# Patient Record
Sex: Male | Born: 1990 | Race: Black or African American | Hispanic: No | Marital: Single | Smoking: Former smoker
Health system: Southern US, Community
[De-identification: ages and names within clinical notes are randomized; demographics above are authoritative.]

## PROBLEM LIST (undated history)

## (undated) DIAGNOSIS — S6290XA Unspecified fracture of unspecified wrist and hand, initial encounter for closed fracture: Secondary | ICD-10-CM

## (undated) DIAGNOSIS — H9203 Otalgia, bilateral: Secondary | ICD-10-CM

## (undated) DIAGNOSIS — Z789 Other specified health status: Secondary | ICD-10-CM

## (undated) HISTORY — PX: NO PAST SURGERIES: SHX2092

## (undated) HISTORY — PX: FRACTURE SURGERY: SHX138

---

## 2013-10-09 ENCOUNTER — Emergency Department (HOSPITAL_COMMUNITY)
Admission: EM | Admit: 2013-10-09 | Discharge: 2013-10-09 | Disposition: A | Discharge status: Home / Self Care | Payer: No Typology Code available for payment source | Attending: Emergency Medicine | Admitting: Emergency Medicine

## 2013-10-09 ENCOUNTER — Encounter (HOSPITAL_COMMUNITY): Payer: Self-pay | Admitting: Emergency Medicine

## 2013-10-09 DIAGNOSIS — IMO0002 Reserved for concepts with insufficient information to code with codable children: Secondary | ICD-10-CM | POA: Insufficient documentation

## 2013-10-09 DIAGNOSIS — Y9289 Other specified places as the place of occurrence of the external cause: Secondary | ICD-10-CM | POA: Insufficient documentation

## 2013-10-09 DIAGNOSIS — Y939 Activity, unspecified: Secondary | ICD-10-CM | POA: Insufficient documentation

## 2013-10-09 DIAGNOSIS — M549 Dorsalgia, unspecified: Secondary | ICD-10-CM

## 2013-10-09 MED ORDER — HYDROCODONE-ACETAMINOPHEN 5-325 MG PO TABS: 2.0000 | ORAL_TABLET | Freq: Four times a day (QID) | ORAL | Status: DC | PRN
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
## 2013-10-09 NOTE — ED Provider Notes (Signed)
Medical screening examination/treatment/procedure(s) were performed by non-physician practitioner and as supervising physician I was immediately available for consultation/collaboration.  EKG Interpretation   None         Tahesha Skeet T Taliesin Hartlage, MD 10/09/13 1436 

## 2014-07-01 ENCOUNTER — Encounter (HOSPITAL_COMMUNITY): Payer: Self-pay | Admitting: Emergency Medicine

## 2014-07-01 ENCOUNTER — Emergency Department (HOSPITAL_COMMUNITY)
Admission: EM | Admit: 2014-07-01 | Discharge: 2014-07-01 | Disposition: A | Discharge status: Home / Self Care | Payer: No Typology Code available for payment source | Attending: Emergency Medicine | Admitting: Emergency Medicine

## 2014-07-01 DIAGNOSIS — Y929 Unspecified place or not applicable: Secondary | ICD-10-CM | POA: Insufficient documentation

## 2014-07-01 DIAGNOSIS — S81009A Unspecified open wound, unspecified knee, initial encounter: Secondary | ICD-10-CM | POA: Insufficient documentation

## 2014-07-01 DIAGNOSIS — Y9301 Activity, walking, marching and hiking: Secondary | ICD-10-CM | POA: Insufficient documentation

## 2014-07-01 DIAGNOSIS — S91009A Unspecified open wound, unspecified ankle, initial encounter: Principal | ICD-10-CM

## 2014-07-01 DIAGNOSIS — IMO0002 Reserved for concepts with insufficient information to code with codable children: Secondary | ICD-10-CM | POA: Insufficient documentation

## 2014-07-01 DIAGNOSIS — S81809A Unspecified open wound, unspecified lower leg, initial encounter: Principal | ICD-10-CM

## 2014-07-01 DIAGNOSIS — S91012A Laceration without foreign body, left ankle, initial encounter: Secondary | ICD-10-CM

## 2014-07-01 DIAGNOSIS — Z23 Encounter for immunization: Secondary | ICD-10-CM | POA: Insufficient documentation

## 2014-07-01 MED ORDER — LIDOCAINE HCL (PF) 1 % IJ SOLN: 5.0000 mL | Freq: Once | INTRAMUSCULAR | Status: DC

## 2014-07-01 MED ORDER — TETANUS-DIPHTH-ACELL PERTUSSIS 5-2.5-18.5 LF-MCG/0.5 IM SUSP
0.5000 mL | Freq: Once | INTRAMUSCULAR | Status: AC
  Administered 2014-07-01: 0.5 mL via INTRAMUSCULAR
  Filled 2014-07-01: qty 0.5

## 2014-07-01 NOTE — ED Notes (Signed)
Discharge and follow up instructions reviewed with pt. Pt verbalized understanding.  

## 2014-07-01 NOTE — ED Notes (Signed)
Irrigated and cleaned laceration to left lower extremity. Gauze applied with tape.

## 2014-07-01 NOTE — Discharge Instructions (Signed)
1. Medications: usual home medications 2. Treatment: rest, drink plenty of fluids, keep sutures clean with warm soap and water, keep bandage dry 3. Follow Up: Please followup with your primary doctor for discussion of your diagnoses and further evaluation after today's visit; if you do not have a primary care doctor use the resource guide provided to find one; suture removal in 7 days  Sutured Wound Care Sutures are stitches that can be used to close wounds. Wound care helps prevent pain and infection.  HOME CARE INSTRUCTIONS   Rest and elevate the injured area until all the pain and swelling are gone.  Only take over-the-counter or prescription medicines for pain, discomfort, or fever as directed by your caregiver.  After 48 hours, gently wash the area with mild soap and water once a day, or as directed. Rinse off the soap. Pat the area dry with a clean towel. Do not rub the wound. This may cause bleeding.  Follow your caregiver's instructions for how often to change the bandage (dressing). Stop using a dressing after 2 days or after the wound stops draining.  If the dressing sticks, moisten it with soapy water and gently remove it.  Apply ointment on the wound as directed.  Avoid stretching a sutured wound.  Drink enough fluids to keep your urine clear or pale yellow.  Follow up with your caregiver for suture removal as directed.  Use sunscreen on your wound for the next 3 to 6 months so the scar will not darken. SEEK IMMEDIATE MEDICAL CARE IF:   Your wound becomes red, swollen, hot, or tender.  You have increasing pain in the wound.  You have a red streak that extends from the wound.  There is pus coming from the wound.  You have a fever.  You have shaking chills.  There is a bad smell coming from the wound.  You have persistent bleeding from the wound. MAKE SURE YOU:   Understand these instructions.  Will watch your condition.  Will get help right away if you are  not doing well or get worse. Document Released: 12/30/2004 Document Revised: 02/14/2012 Document Reviewed: 03/28/2011 [facility] Patient Information 2015 [place], [place]. This information is not intended to replace advice given to you by your health care provider. Make sure you discuss any questions you have with your health care provider.

## 2014-07-01 NOTE — ED Provider Notes (Signed)
CSN: [id number]     Arrival date & time 07/01/14  1736 History  This chart was scribed for non-physician provider Dierdre ForthHannah Colleen Kotlarz, PA-C, working with Gerhard Munchobert Lockwood, MD by Phillis HaggisGabriella Gaje, ED Scribe. This patient was seen in room TR05C/TR05C and patient care was started at 7:56 PM.    Chief Complaint  Patient presents with  . Extremity Laceration    The patient was walking down a cement stairs and he scraped his ankle.  He said something "sharp was sticking out" and that is what cut him.  He has an inch laceration to the left ankle.   The history is provided by the patient and medical records. No language interpreter was used.   HPI Comments: Francisco Gibson is a 23 y.o. male who presents to the Emergency Department complaining of left ankle laceration onset 6 hours ago. He states that he was walking when he hit the corner of the cement stairs and cut his left ankle, but is unsure of what exactly cut him. He reports that it has been a while since his last tdap and denies ever having stitches. He denies any allergies. Denies numbness, weakness, joint pain.  History reviewed. No pertinent past medical history. History reviewed. No pertinent past surgical history. History reviewed. No pertinent family history. History  Substance Use Topics  . Smoking status: Never Smoker   . Smokeless tobacco: Never Used  . Alcohol Use: No    Review of Systems  Constitutional: Negative for fever.  Gastrointestinal: Negative for nausea and vomiting.  Skin: Positive for wound.  Allergic/Immunologic: Negative for immunocompromised state.  Neurological: Negative for weakness and numbness.  Hematological: Does not bruise/bleed easily.  Psychiatric/Behavioral: The patient is not nervous/anxious.    Allergies  Review of patient's allergies indicates no known allergies.  Home Medications   Prior to Admission medications   Not on File   BP 122/78  Pulse 90  Temp(Src) 97.8 F (36.6 C) (Oral)  Resp 18   SpO2 97% Physical Exam  Nursing note and vitals reviewed. Constitutional: He is oriented to person, place, and time. He appears well-developed and well-nourished. No distress.  HENT:  Head: Normocephalic and atraumatic.  Eyes: Conjunctivae are normal. No scleral icterus.  Neck: Normal range of motion.  Cardiovascular: Normal rate, regular rhythm, normal heart sounds and intact distal pulses.   No murmur heard. Capillary refill < 3 sec  Pulmonary/Chest: Effort normal and breath sounds normal. No respiratory distress.  Musculoskeletal: Normal range of motion. He exhibits no edema.  3 cm laceration on lateral aspect of left ankle. Full ROM of left ankle.   Neurological: He is alert and oriented to person, place, and time.  Sensation intact to dull and sharp Strength 5/5 in the LLE including dorsiflexion and plantar flexion  Skin: Skin is warm and dry. He is not diaphoretic.  Psychiatric: He has a normal mood and affect.    ED Course  LACERATION REPAIR Date/Time: 07/01/2014 8:19 PM Performed by: Dierdre ForthMUTHERSBAUGH, Queenie Aufiero Authorized by: Dierdre ForthMUTHERSBAUGH, Lashann Hagg Consent: Verbal consent obtained. Risks and benefits: risks, benefits and alternatives were discussed Consent given by: patient Patient understanding: patient states understanding of the procedure being performed Patient consent: the patient's understanding of the procedure matches consent given Procedure consent: procedure consent matches procedure scheduled Relevant documents: relevant documents present and verified Site marked: the operative site was marked Required items: required blood products, implants, devices, and special equipment available Patient identity confirmed: verbally with patient and arm band Time out: Immediately prior to procedure  a "time out" was called to verify the correct patient, procedure, equipment, support staff and site/side marked as required. Body area: lower extremity Location details: left  ankle Laceration length: 3 cm Foreign bodies: no foreign bodies Tendon involvement: none Nerve involvement: none Vascular damage: yes Anesthesia: local infiltration Local anesthetic: lidocaine 2% without epinephrine Anesthetic total: 3 ml Patient sedated: no Preparation: Patient was prepped and draped in the usual sterile fashion. Irrigation solution: saline Irrigation method: syringe Amount of cleaning: extensive Debridement: none Degree of undermining: none Skin closure: 3-0 Prolene Number of sutures: 3 Technique: simple and horizontal mattress (2 simple, one horizontal mattress) Approximation: close Approximation difficulty: complex Dressing: 4x4 sterile gauze Patient tolerance: Patient tolerated the procedure well with no immediate complications.   (including critical care time) DIAGNOSTIC STUDIES: Oxygen Saturation is 97% on room air, normal by my interpretation.    COORDINATION OF CARE: 8:00 PM-Discussed treatment plan which includes laceration repair and f/u in 7 days with pt at bedside and pt agreed to plan.   Labs Review Labs Reviewed - No data to display  Imaging Review No results found.   EKG Interpretation None      MDM   Final diagnoses:  Laceration of left ankle, initial encounter   Francisco Gibson presents with laceration to the Left ankle.  Tdap booster given.Pressure irrigation performed. Laceration occurred < 8 hours prior to repair which was well tolerated. Pt has no co morbidities to effect normal wound healing. Discussed suture home care w pt and answered questions. Pt to f-u for wound check with primary care provider or urgent care and suture removal in 7 days. Patient is to return to emergency Department for fever, evidence of infection. Pt is hemodynamically stable w no complaints prior to dc.    BP 122/78  Pulse 90  Temp(Src) 97.8 F (36.6 C) (Oral)  Resp 18  SpO2 97%    I personally performed the services described in this  documentation, which was scribed in my presence. The recorded information has been reviewed and is accurate.      Dahlia Client Thula Stewart, PA-C 07/01/14 2021

## 2014-07-01 NOTE — ED Notes (Signed)
The patient was walking down a cement stairs and he scraped his ankle.  He said something "sharp was sticking out" and that is what cut him.  He has an inch laceration to the left ankle. He rates his pain 10/10.  Bleeding is controlled.

## 2014-07-02 NOTE — ED Provider Notes (Signed)
  Medical screening examination/treatment/procedure(s) were performed by non-physician practitioner and as supervising physician I was immediately available for consultation/collaboration.   EKG Interpretation None         Nicki Gracy, MD 07/02/14 0014 

## 2014-08-15 ENCOUNTER — Encounter (HOSPITAL_COMMUNITY): Payer: Self-pay | Admitting: Emergency Medicine

## 2014-08-15 ENCOUNTER — Emergency Department (HOSPITAL_COMMUNITY)
Admission: EM | Admit: 2014-08-15 | Discharge: 2014-08-15 | Disposition: A | Discharge status: Home / Self Care | Payer: No Typology Code available for payment source | Attending: Emergency Medicine | Admitting: Emergency Medicine

## 2014-08-15 DIAGNOSIS — H2000 Unspecified acute and subacute iridocyclitis: Secondary | ICD-10-CM

## 2014-08-15 DIAGNOSIS — S0510XA Contusion of eyeball and orbital tissues, unspecified eye, initial encounter: Secondary | ICD-10-CM | POA: Insufficient documentation

## 2014-08-15 DIAGNOSIS — Y92838 Other recreation area as the place of occurrence of the external cause: Secondary | ICD-10-CM

## 2014-08-15 DIAGNOSIS — G43109 Migraine with aura, not intractable, without status migrainosus: Secondary | ICD-10-CM | POA: Insufficient documentation

## 2014-08-15 DIAGNOSIS — Y9367 Activity, basketball: Secondary | ICD-10-CM | POA: Insufficient documentation

## 2014-08-15 DIAGNOSIS — IMO0002 Reserved for concepts with insufficient information to code with codable children: Secondary | ICD-10-CM | POA: Insufficient documentation

## 2014-08-15 DIAGNOSIS — Y9239 Other specified sports and athletic area as the place of occurrence of the external cause: Secondary | ICD-10-CM | POA: Insufficient documentation

## 2014-08-15 MED ORDER — TETRACAINE HCL 0.5 % OP SOLN
1.0000 [drp] | Freq: Once | OPHTHALMIC | Status: AC
  Administered 2014-08-15: 1 [drp] via OPHTHALMIC
  Filled 2014-08-15: qty 2

## 2014-08-15 MED ORDER — PREDNISOLONE ACETATE 1 % OP SUSP: 2.0000 [drp] | Freq: Four times a day (QID) | OPHTHALMIC | Status: AC

## 2014-08-15 MED ORDER — HYDROCODONE-ACETAMINOPHEN 5-325 MG PO TABS: 1.0000 | ORAL_TABLET | ORAL | Status: DC | PRN

## 2014-08-15 MED ORDER — HYDROCODONE-ACETAMINOPHEN 5-325 MG PO TABS
2.0000 | ORAL_TABLET | Freq: Once | ORAL | Status: AC
Start: 2014-08-15 — End: 2014-08-15
  Administered 2014-08-15: 2 via ORAL
  Filled 2014-08-15: qty 2

## 2014-08-15 MED ORDER — FLUORESCEIN SODIUM 1 MG OP STRP
1.0000 | ORAL_STRIP | Freq: Once | OPHTHALMIC | Status: AC
  Administered 2014-08-15: 1 via OPHTHALMIC
  Filled 2014-08-15: qty 1

## 2014-08-15 NOTE — ED Provider Notes (Signed)
CSN: [id number]     Arrival date & time 08/15/14  4098 History   First MD Initiated Contact with Patient 08/15/14 [phone]     Chief Complaint  Patient presents with  . Eye Injury     (Consider location/radiation/quality/duration/timing/severity/associated sxs/prior Treatment) Patient is a 23 y.o. male presenting with eye injury.  Eye Injury This is a new problem. The current episode started 2 days ago. The problem occurs constantly. The problem has been gradually worsening. Associated symptoms comments: Blurry vision, eye pain. Exacerbated by: light, eye movement. Relieved by: closing both eyes. Treatments tried: ibuprofen initially helped, not now.    History reviewed. No pertinent past medical history. History reviewed. No pertinent past surgical history. No family history on file. History  Substance Use Topics  . Smoking status: Never Smoker   . Smokeless tobacco: Never Used  . Alcohol Use: Yes     Comment: ocassional    Review of Systems  All other systems reviewed and are negative.     Allergies  Review of patient's allergies indicates no known allergies.  Home Medications   Prior to Admission medications   Not on File   BP 125/76  Pulse 65  Temp(Src) 97.4 F (36.3 C) (Oral)  Resp 12  SpO2 100% Physical Exam  Nursing note and vitals reviewed. Constitutional: He is oriented to person, place, and time. He appears well-developed and well-nourished. No distress.  HENT:  Head: Normocephalic and atraumatic.  Eyes: EOM are normal. Pupils are equal, round, and reactive to light. Right conjunctiva is injected. No scleral icterus.  Neck: Neck supple.  Cardiovascular: Normal rate and intact distal pulses.   Pulmonary/Chest: Effort normal. No stridor. No respiratory distress.  Abdominal: Normal appearance. He exhibits no distension.  Neurological: He is alert and oriented to person, place, and time.  Skin: Skin is warm and dry. No rash noted.  Psychiatric: He has a  normal mood and affect. His behavior is normal.    ED Course  Procedures (including critical care time) Labs Review Labs Reviewed - No data to display  Imaging Review No results found.   EKG Interpretation None      MDM   Final diagnoses:  Acute iritis, left eye    23 yo male who does not wear contacts was playing basketball 2 days ago and was struck in his left eye.  He had some mild pain thereafter until this morning when his pain had worsened significantly.  History and exam support traumatic iritis.  Intraocular pressures were normal and he had no fluorescein uptake on wood's lamp exam.    I discussed his case with Dr. Randon Goldsmith [facility]) who will pt for close follow up tomorrow.  He rec'd pred-acetate drops, but asked that we defer cycloplegics until his exam tomorrow.  Have also dc'd with some norco for pain control.    Candyce Churn III, MD 08/15/14 [phone]

## 2014-08-15 NOTE — ED Notes (Signed)
Unable to do visual acuity due to pt.s pain level in the rt. eye

## 2014-08-15 NOTE — ED Notes (Signed)
Pt. Was hit in the Lt.  eye a few days ago while playing b basketball with a hand.   Lt. Eye is red and tearing. Blurred vision.

## 2014-08-15 NOTE — Discharge Instructions (Signed)
Iritis Iritis is an inflammation of the colored part of the eye (iris). Other parts at the front of the eye may also be inflamed. The iris is part of the middle layer of the eyeball which is called the uvea or the uveal track. Any part of the uveal track can become inflamed. The other portions of the uveal track are the choroid (the thin membrane under the outer layer of the eye), and the ciliary body (joins the choroid and the iris and produces the fluid in the front of the eye).  It is extremely important to treat iritis early, as it may lead to internal eye damage causing scarring or diseases such as glaucoma. Some people have only one attack of iritis (in one or both eyes) in their lifetime, while others may get it many times. CAUSES Iritis can be associated with many different diseases, but mostly occurs in otherwise healthy people. Examples of diseases that can be associated with iritis include:  Diseases where the body's immune system attacks tissues within your own body (autoimmune diseases).  Infections (tuberculosis, gonorrhea, fungus infections, Lyme disease, infection of the lining of the heart).  Trauma or injury.  Eye diseases (acute glaucoma and others).  Inflammation from other parts of the uveal track.  Severe eye infections.  Other rare diseases. SYMPTOMS  Eye pain or aching.  Sensitivity to light.  Loss of sight or blurred vision.  Redness of the eye. This is often accompanied by a ring of redness around the outside of the cornea, or clear covering at the front of the eye (ciliary flush).  Excessive tearing of the eye(s).  A small pupil that does not enlarge in the dark and stays smaller than the other eye's pupil.  A whitish area that obscures the lower part of the colored circular iris. Sometimes this is visible when looking at the eye, where the whitish area has a "fluid level" or flat top. This is called a "hypopyon" and is actually pus inside the eye. Since  iritis causes the eye to become red, it is often confused with a much less dangerous form of "pink eye" or conjunctivitis. One of the most important symptoms is sensitivity to light. Anytime there is redness, discomfort in the eye(s) and extreme light sensitivity, it is extremely important to see an ophthalmologist as soon as possible. TREATMENT Acute iritis requires prompt medical evaluation by an eye specialist (ophthalmologist.) Treatment depends on the underlying cause but may include:  Corticosteroid eye drops and dilating eye drops. Follow your caregiver's exact instructions on taking and stopping corticosteroid medications (drops or pills).  Occasionally, the iritis will be so severe that it will not respond to commonly used medications. If this happens, it may be necessary to use steroid injections. The injections are given under the eye's outer surface. Sometimes oral medications are given. The decision on treatment used for iritis is usually made on an individual basis. HOME CARE INSTRUCTIONS Your care giver will give specific instructions regarding the use of eye medications or other medications. Be certain to follow all instructions in both taking and stopping the medications. SEEK IMMEDIATE MEDICAL CARE IF:  You have redness of one or both eye.  You experience a great deal of light sensitivity.  You have pain or aching in either eye. MAKE SURE YOU:   Understand these instructions.  Will watch your condition.  Will get help right away if you are not doing well or get worse. Document Released: 11/22/2005 Document Revised: 02/14/2012 Document   Reviewed: 05/12/2007 Exit[employer] Patient Information 2015 [employer]. This information is not intended to replace advice given to you by your health care provider. Make sure you discuss any questions you have with your health care provider.  

## 2014-08-15 NOTE — ED Notes (Signed)
Dr. Loretha Stapler aware of pt. Having eye pain.  Toto pen and [employer] in the room, Dr. Loretha Stapler is aware.

## 2016-07-27 ENCOUNTER — Encounter (HOSPITAL_COMMUNITY): Payer: Self-pay

## 2016-07-27 ENCOUNTER — Emergency Department (HOSPITAL_COMMUNITY)
Admission: EM | Admit: 2016-07-27 | Discharge: 2016-07-27 | Disposition: A | Discharge status: Home / Self Care | Payer: Self-pay | Attending: Emergency Medicine | Admitting: Emergency Medicine

## 2016-07-27 DIAGNOSIS — X500XXA Overexertion from strenuous movement or load, initial encounter: Secondary | ICD-10-CM | POA: Insufficient documentation

## 2016-07-27 DIAGNOSIS — Y990 Civilian activity done for income or pay: Secondary | ICD-10-CM | POA: Insufficient documentation

## 2016-07-27 DIAGNOSIS — Y9389 Activity, other specified: Secondary | ICD-10-CM | POA: Insufficient documentation

## 2016-07-27 DIAGNOSIS — M542 Cervicalgia: Secondary | ICD-10-CM | POA: Insufficient documentation

## 2016-07-27 DIAGNOSIS — Z79899 Other long term (current) drug therapy: Secondary | ICD-10-CM | POA: Insufficient documentation

## 2016-07-27 DIAGNOSIS — Y9289 Other specified places as the place of occurrence of the external cause: Secondary | ICD-10-CM | POA: Insufficient documentation

## 2016-07-27 MED ORDER — CYCLOBENZAPRINE HCL 10 MG PO TABS: 10.0000 mg | ORAL_TABLET | Freq: Three times a day (TID) | ORAL | 0 refills | Status: DC | PRN

## 2016-07-27 NOTE — Discharge Instructions (Signed)
Take flexeril as needed for muscle relaxant. May take NSAIDs for pain relief. Try heating pad or massage. If symptoms worsen fail to improve return to the ED.

## 2016-07-27 NOTE — ED Notes (Signed)
Pt works for [employer]. Left neck pain started after work 3 days ago. Denies paresthesias.

## 2016-07-27 NOTE — ED Triage Notes (Signed)
Onset 3 nights ago spasms in left side of neck.  To turn head he has to move body.  Took ibuprofen last night with some relief, he was able to go to sleep.

## 2016-07-27 NOTE — ED Provider Notes (Signed)
[facility]-EMERGENCY DEPT Provider Note   CSN: [id number] Arrival date & time: 07/27/16  1203  By signing my name below, I, Placido SouLogan Joldersma, attest that this documentation has been prepared under the direction and in the presence of Rise MuKenneth T Leaphart, PA-C. Electronically Signed: Placido SouLogan Joldersma, ED Scribe. 07/27/16. 12:59 PM.   History   Chief Complaint Chief Complaint  Patient presents with  . Torticollis    HPI HPI Comments: Francisco Gibson is a [age] y.o. male who presents to the Emergency Department complaining of constant, waxing and waning, moderate, left sided neck pain x 3 days. 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 He states his pain feels like a "strain" and reports an associated, mild, intermittent, HA which began following his neck pain. He has taken ibuprofen with his last dosage last night which provided mild relief and allowed him to sleep. He states that movement of his head slightly loosens and alleviates his pain. Pt denies visual changes, fever, chills, n/v/d, CP, SOB, difficulty ambulating, dysphagia, sore throat and abd pain.   The history is provided by the patient. No language interpreter was used.    History reviewed. No pertinent past medical history.  There are no active problems to display for this patient.   History reviewed. No pertinent surgical history.    Home Medications    Prior to Admission medications   Medication Sig Start Date End Date Taking? Authorizing Provider  HYDROcodone-acetaminophen (NORCO/VICODIN) 5-325 MG per tablet Take 1 tablet by mouth every 4 (four) hours as needed for moderate pain or severe pain. 08/15/14   Blake DivineJohn Wofford, MD  prednisoLONE acetate (PRED FORTE) 1 % ophthalmic suspension Place 2 drops into the left eye 4 (four) times daily. 08/15/14   Blake DivineJohn Wofford, MD    Family History History reviewed. No pertinent family history.  Social History Social History  Substance Use Topics  . Smoking status: Never Smoker    . Smokeless tobacco: Never Used  . Alcohol use Yes     Comment: ocassional     Allergies   Review of patient's allergies indicates no known allergies.   Review of Systems Review of Systems  Constitutional: Negative for chills and fever.  HENT: Positive for congestion. Negative for ear pain, postnasal drip, rhinorrhea, sinus pressure, sore throat and trouble swallowing.   Eyes: Negative for visual disturbance.  Respiratory: Negative for cough and shortness of breath.   Cardiovascular: Negative for chest pain and palpitations.  Gastrointestinal: Negative for abdominal pain, diarrhea, nausea and vomiting.  Musculoskeletal: Positive for myalgias and neck pain.  Skin: Negative for color change and wound.  Neurological: Positive for headaches. Negative for weakness and numbness.  Psychiatric/Behavioral: Negative for sleep disturbance.   Physical Exam Updated Vital Signs BP 130/84 (BP Location: Right Arm)   Pulse 76   Temp 98.5 F (36.9 C) (Oral)   Resp 14   SpO2 97%   Physical Exam  Constitutional: He is oriented to person, place, and time. He appears well-developed and well-nourished.  HENT:  Head: Normocephalic and atraumatic.  Right Ear: No tenderness. No mastoid tenderness.  Left Ear: No tenderness. No mastoid tenderness.  Mouth/Throat: Oropharynx is clear and moist. No oropharyngeal exudate.  Unable to visualize TM due to cerumen, no pain to palpation of auricle or tragus bilaterally  Eyes: Conjunctivae and EOM are normal. Pupils are equal, round, and reactive to light.  Neck: Neck supple.  Active rom limited due to pain  Cardiovascular: Normal rate, regular rhythm and  normal heart sounds.   Pulmonary/Chest: Effort normal and breath sounds normal. No respiratory distress.  Abdominal: Soft. Bowel sounds are normal. There is no tenderness.  Musculoskeletal: Normal range of motion.       Arms: Lymphadenopathy:    He has no cervical adenopathy.  Neurological: He is  alert and oriented to person, place, and time. He has normal strength. No cranial nerve deficit or sensory deficit.  Skin: Skin is warm and dry.  Psychiatric: He has a normal mood and affect.  Nursing note and vitals reviewed.   ED Treatments / Results  Labs (all labs ordered are listed, but only abnormal results are displayed) Labs Reviewed - No data to display  EKG  EKG Interpretation None       Radiology No results found.  Procedures Procedures  DIAGNOSTIC STUDIES: Oxygen Saturation is 99% on RA, normal by my interpretation.    COORDINATION OF CARE: 12:59 PM Discussed next steps with pt. Pt verbalized understanding and is agreeable with the plan.    Medications Ordered in ED Medications - No data to display   Initial Impression / Assessment and Plan / ED Course  I have reviewed the triage vital signs and the nursing notes.  Pertinent labs & imaging results that were available during my care of the patient were reviewed by me and considered in my medical decision making (see chart for details).  Clinical Course  Pain likely due to strain from work. Very ttp over left trapezius muscle. No signs of infection. Afebrile. No sore throat or otalgia. Will treat with flexeril. Patient encouraged to take ibuprofen and tylenol at home for pain. Use heating pad to help with muscle stiffness. If symptoms don't improve over the next week follow up with primary care doctor or return to the ED. Patient verbalized understanding. Discussed plan of care with PA Kirichenko. Patient discharged home with stable VS and in NAD.  I personally performed the services described in this documentation, which was scribed in my presence. The recorded information has been reviewed and is accurate.  Final Clinical Impressions(s) / ED Diagnoses   Final diagnoses:  Neck pain    New Prescriptions Discharge Medication List as of 07/27/2016  1:18 PM    START taking these medications   Details   cyclobenzaprine (FLEXERIL) 10 MG tablet Take 1 tablet (10 mg total) by mouth 3 (three) times daily as needed for muscle spasms., Starting Tue 07/27/2016, Print         Rise MuKenneth T Leaphart, PA-C 07/27/16 1645    Marily MemosJason Mesner, MD 07/28/16 [phone]

## 2017-02-25 ENCOUNTER — Encounter (HOSPITAL_COMMUNITY): Payer: Self-pay | Admitting: *Deleted

## 2017-02-25 ENCOUNTER — Emergency Department (HOSPITAL_COMMUNITY)
Admission: EM | Admit: 2017-02-25 | Discharge: 2017-02-25 | Disposition: A | Discharge status: Home / Self Care | Payer: Self-pay | Attending: Emergency Medicine | Admitting: Emergency Medicine

## 2017-02-25 DIAGNOSIS — H60392 Other infective otitis externa, left ear: Secondary | ICD-10-CM | POA: Insufficient documentation

## 2017-02-25 DIAGNOSIS — H9202 Otalgia, left ear: Secondary | ICD-10-CM

## 2017-02-25 HISTORY — DX: Otalgia, bilateral: H92.03

## 2017-02-25 MED ORDER — LIDOCAINE-EPINEPHRINE-TETRACAINE (LET) SOLUTION
3.0000 mL | Freq: Once | NASAL | Status: AC
  Administered 2017-02-25: 3 mL via TOPICAL
  Filled 2017-02-25: qty 3

## 2017-02-25 MED ORDER — CIPROFLOXACIN-DEXAMETHASONE 0.3-0.1 % OT SUSP
4.0000 [drp] | Freq: Once | OTIC | Status: AC
  Administered 2017-02-25: 4 [drp] via OTIC
  Filled 2017-02-25: qty 7.5

## 2017-02-25 MED ORDER — DOXYCYCLINE HYCLATE 100 MG PO CAPS: 100.0000 mg | ORAL_CAPSULE | Freq: Two times a day (BID) | ORAL | 0 refills | Status: AC

## 2017-02-25 MED ORDER — IBUPROFEN 800 MG PO TABS
800.0000 mg | ORAL_TABLET | Freq: Once | ORAL | Status: AC
  Administered 2017-02-25: 800 mg via ORAL
  Filled 2017-02-25: qty 1

## 2017-02-25 MED ORDER — CIPROFLOXACIN HCL 500 MG PO TABS: 500.0000 mg | ORAL_TABLET | Freq: Two times a day (BID) | ORAL | 0 refills | Status: AC

## 2017-02-25 MED ORDER — CIPROFLOXACIN-HYDROCORTISONE 0.2-1 % OT SUSP: 3.0000 [drp] | Freq: Two times a day (BID) | OTIC | 0 refills | Status: DC

## 2017-02-25 NOTE — ED Provider Notes (Signed)
[facility]-EMERGENCY DEPT Provider Note   CSN: [id number] Arrival date & time: 02/25/17  1311   By signing my name below, I, Soijett Blue, attest that this documentation has been prepared under the direction and in the presence of Mathews Robinsons, PA-C Electronically Signed: Soijett Blue, ED Scribe. 02/25/17. 2:14 PM.  History   Chief Complaint Chief Complaint  Patient presents with  . Otalgia    swelling to external ear and drainage from inside of canal    HPI Francisco Gibson is a 26 y.o. male who presents to the Emergency Department complaining of gradually worsening left ear pain onset 2-3 days ago. Pt reports associated swelling to outer left ear and intermittent decreased hearing to left ear. Pt has tried ibuprofen with his last dose being yesterday with no relief of his symptoms. He notes that he has a hx of bilateral ear aches, but denies similar symptoms in the past. He denies trouble swallowing, sore throat, fever, chills, nausea, vomiting, recent trauma, and any other symptoms. Denies PMHx, daily medication use, or allergies to medications.   The history is provided by the patient. No language interpreter was used.    Past Medical History:  Diagnosis Date  . Otalgia of both ears     There are no active problems to display for this patient.   History reviewed. No pertinent surgical history.     Home Medications    Prior to Admission medications   Medication Sig Start Date End Date Taking? Authorizing Provider  ciprofloxacin (CIPRO) 500 MG tablet Take 1 tablet (500 mg total) by mouth every 12 (twelve) hours. 02/25/17 03/04/17  Georgiana Shore, PA-C  ciprofloxacin-hydrocortisone (CIPRO [facility]) otic suspension Place 3 drops into the left ear 2 (two) times daily. 02/25/17   Georgiana Shore, PA-C  cyclobenzaprine (FLEXERIL) 10 MG tablet Take 1 tablet (10 mg total) by mouth 3 (three) times daily as needed for muscle spasms. 07/27/16   Rise Mu, PA-C  doxycycline  (VIBRAMYCIN) 100 MG capsule Take 1 capsule (100 mg total) by mouth 2 (two) times daily. 02/25/17 03/04/17  Georgiana Shore, PA-C  HYDROcodone-acetaminophen (NORCO/VICODIN) 5-325 MG per tablet Take 1 tablet by mouth every 4 (four) hours as needed for moderate pain or severe pain. 08/15/14   Blake Divine, MD  prednisoLONE acetate (PRED FORTE) 1 % ophthalmic suspension Place 2 drops into the left eye 4 (four) times daily. 08/15/14   Blake Divine, MD    Family History No family history on file.  Social History Social History  Substance Use Topics  . Smoking status: Never Smoker  . Smokeless tobacco: Never Used  . Alcohol use Yes     Comment: ocassional     Allergies   Patient has no known allergies.   Review of Systems Review of Systems  Constitutional: Negative for chills and fever.  HENT: Positive for ear discharge (left) and ear pain (left). Negative for congestion, sinus pain, sinus pressure, sore throat and trouble swallowing.        +Swelling to outer ear with decreased hearing to left ear  Eyes: Negative for pain and discharge.  Respiratory: Negative for cough, choking, chest tightness, shortness of breath, wheezing and stridor.   Cardiovascular: Negative for chest pain, palpitations and leg swelling.  Gastrointestinal: Negative for abdominal pain, nausea and vomiting.  Musculoskeletal: Negative for neck pain and neck stiffness.  Skin: Negative for color change, pallor and rash.       Bulging mass at tragus  Physical Exam Updated Vital Signs BP (!) 129/93 (BP Location: Right Arm)   Pulse 65   Temp 98.6 F (37 C) (Oral)   Resp 14   Wt 78 kg   SpO2 99%   Physical Exam  Constitutional: He is oriented to person, place, and time. He appears well-developed and well-nourished. No distress.  Patient is afebrile, non-toxic appearing, seating comfortably in chair in no acute distress.  HENT:  Head: Normocephalic and atraumatic.  Mouth/Throat: Uvula is midline and  mucous membranes are normal. No dental abscesses. Oropharyngeal exudate present.  Left tragus with 1.5 cm bulging mass that is soft to palpation. Ear canal is completely occluded due to swelling. Pain with palpation of tragus and pulling on pinna. Symmetrical arches. Uvula midline. No evidence of dental abscess. Mild exudate on right.  Eyes: EOM are normal.  Neck: Neck supple.  Cardiovascular: Normal rate, regular rhythm and normal heart sounds.  Exam reveals no gallop and no friction rub.   No murmur heard. Pulmonary/Chest: Effort normal and breath sounds normal. No respiratory distress. He has no wheezes. He has no rales.  Abdominal: He exhibits no distension.  Musculoskeletal: Normal range of motion.  Lymphadenopathy:       Head (left side): Preauricular adenopathy present.  Left periauricular lymphadenopathy.  Neurological: He is alert and oriented to person, place, and time.  Skin: Skin is warm and dry.  Psychiatric: He has a normal mood and affect. His behavior is normal.  Nursing note and vitals reviewed.    ED Treatments / Results  DIAGNOSTIC STUDIES: Oxygen Saturation is 98% on RA, nl by my interpretation.    COORDINATION OF CARE: 2:09 PM Discussed treatment plan with pt at bedside which includes ibuprofen, consult with ENT, labs, and pt agreed to plan.  3:28 PM- Consult with ENT specialist, Dr. Annalee GentaShoemaker, who recommends ear wick, aspiration, abx ear drops, and oral abx Rx with follow up in clinic early next week.  Labs Labs Reviewed  AEROBIC CULTURE (SUPERFICIAL SPECIMEN)    Procedures .Marland Kitchen.Incision and Drainage Date/Time: 02/25/2017 4:01 PM Performed by: Mathews RobinsonsMITCHELL, Jourdin Gens B Authorized by: Mathews RobinsonsMITCHELL, Mayela Bullard B   Consent:    Consent obtained:  Verbal   Consent given by:  Patient   Risks discussed:  Incomplete drainage, infection and pain Location:    Type:  Abscess   Location:  Head   Head location:  L external ear Pre-procedure details:    Skin preparation:   Betadine Anesthesia (see MAR for exact dosages):    Anesthesia method:  Topical application   Topical anesthetic:  LET Procedure type:    Complexity:  Simple Procedure details:    Needle aspiration: yes     Needle size:  18 G   Incision types:  Single straight   Incision depth:  Dermal   Scalpel blade:  11   Wound management:  Probed and deloculated   Drainage:  Purulent   Drainage amount:  Copious   Wound treatment:  Wound left open Post-procedure details:    Patient tolerance of procedure:  Tolerated well, no immediate complications       (including critical care time)  Medications Ordered in ED Medications  ibuprofen (ADVIL,MOTRIN) tablet 800 mg (800 mg Oral Given 02/25/17 1422)  ciprofloxacin-dexamethasone (CIPRODEX) 0.3-0.1 % otic suspension 4 drop (4 drops Left Ear Given by Other 02/25/17 1606)  lidocaine-EPINEPHrine-tetracaine (LET) solution (3 mLs Topical Given by Other 02/25/17 1606)     Initial Impression / Assessment and Plan / ED Course  I  have reviewed the triage vital signs and the nursing notes.  Pertinent lab results that were available during my care of the patient were reviewed by me and considered in my medical decision making (see chart for details).  Patient presents with two days left sided otalgia. Bulging mass at the tragus. Occluded ear canal limiting exam. No hx of trauma. No systemic symptoms. Afebrile and non-toxic appearing. Patient is otherwise healthy.  Consult with ENT specialist, Dr. Annalee Genta, who recommends ear wick, needle aspiration, abx ear drops, and oral abx Rx with follow up early next week in office.  Discussed strict return precautions and advised to return to the emergency department if experiencing any new or worsening symptoms. Instructions were understood and patient agreed with discharge plan. Patient was discussed with Dr. Madilyn Hook who has seen patient and agrees with assessment and plan.  Final Clinical Impressions(s) / ED  Diagnoses   Final diagnoses:  Otalgia of left ear  Other infective acute otitis externa of left ear    New Prescriptions Discharge Medication List as of 02/25/2017  5:07 PM    START taking these medications   Details  ciprofloxacin (CIPRO) 500 MG tablet Take 1 tablet (500 mg total) by mouth every 12 (twelve) hours., Starting Fri 02/25/2017, Until Fri 03/04/2017, Print    ciprofloxacin-hydrocortisone (CIPRO [facility]) otic suspension Place 3 drops into the left ear 2 (two) times daily., Starting Fri 02/25/2017, Print    doxycycline (VIBRAMYCIN) 100 MG capsule Take 1 capsule (100 mg total) by mouth 2 (two) times daily., Starting Fri 02/25/2017, Until Fri 03/04/2017, Print       I personally performed the services described in this documentation, which was scribed in my presence. The recorded information has been reviewed and is accurate.     Georgiana Shore, PA-C 02/25/17 2007    Tilden Fossa, MD 02/28/17 [phone]

## 2017-02-25 NOTE — ED Notes (Signed)
Discharge instructions, follow up care, and rx x2 reviewed with patient. Patient verbalized understanding. 

## 2017-02-25 NOTE — ED Triage Notes (Signed)
Pt is here for pain and swelling to the left ear as well as drainage from ear canal.  Pt reports that "I always have earaches" but it became worse 2-3 days ago.  Pt has intermittent hearing from that ear.  No fever or chills

## 2017-02-25 NOTE — Discharge Instructions (Signed)
Follow up on Monday with ENT, Dr. Annalee GentaShoemaker.

## 2017-02-28 LAB — AEROBIC CULTURE  (SUPERFICIAL SPECIMEN): CULTURE: NORMAL

## 2017-04-11 ENCOUNTER — Emergency Department (HOSPITAL_COMMUNITY): Payer: Self-pay

## 2017-04-11 ENCOUNTER — Emergency Department (HOSPITAL_COMMUNITY)
Admission: EM | Admit: 2017-04-11 | Discharge: 2017-04-12 | Disposition: A | Discharge status: Home / Self Care | Payer: Self-pay | Attending: Emergency Medicine | Admitting: Emergency Medicine

## 2017-04-11 ENCOUNTER — Encounter (HOSPITAL_COMMUNITY): Payer: Self-pay | Admitting: Vascular Surgery

## 2017-04-11 DIAGNOSIS — Y999 Unspecified external cause status: Secondary | ICD-10-CM | POA: Insufficient documentation

## 2017-04-11 DIAGNOSIS — S41111A Laceration without foreign body of right upper arm, initial encounter: Secondary | ICD-10-CM | POA: Insufficient documentation

## 2017-04-11 DIAGNOSIS — Y939 Activity, unspecified: Secondary | ICD-10-CM | POA: Insufficient documentation

## 2017-04-11 DIAGNOSIS — W268XXA Contact with other sharp object(s), not elsewhere classified, initial encounter: Secondary | ICD-10-CM | POA: Insufficient documentation

## 2017-04-11 DIAGNOSIS — S471XXA Crushing injury of right shoulder and upper arm, initial encounter: Secondary | ICD-10-CM

## 2017-04-11 DIAGNOSIS — R202 Paresthesia of skin: Secondary | ICD-10-CM | POA: Insufficient documentation

## 2017-04-11 DIAGNOSIS — S51011A Laceration without foreign body of right elbow, initial encounter: Secondary | ICD-10-CM | POA: Insufficient documentation

## 2017-04-11 DIAGNOSIS — Y929 Unspecified place or not applicable: Secondary | ICD-10-CM | POA: Insufficient documentation

## 2017-04-11 MED ORDER — LIDOCAINE HCL (PF) 1 % IJ SOLN
5.0000 mL | Freq: Once | INTRAMUSCULAR | Status: AC
  Administered 2017-04-11: 5 mL via INTRADERMAL
  Filled 2017-04-11: qty 5

## 2017-04-11 MED ORDER — OXYCODONE-ACETAMINOPHEN 5-325 MG PO TABS
1.0000 | ORAL_TABLET | Freq: Once | ORAL | Status: AC
  Administered 2017-04-11: 1 via ORAL
  Filled 2017-04-11: qty 1

## 2017-04-11 MED ORDER — LIDOCAINE HCL (PF) 1 % IJ SOLN
10.0000 mL | Freq: Once | INTRAMUSCULAR | Status: AC
  Administered 2017-04-11: 10 mL
  Filled 2017-04-11: qty 10

## 2017-04-11 MED ORDER — CEPHALEXIN 500 MG PO CAPS: 500.0000 mg | ORAL_CAPSULE | Freq: Four times a day (QID) | ORAL | 0 refills | Status: DC

## 2017-04-11 MED ORDER — OXYCODONE-ACETAMINOPHEN 5-325 MG PO TABS: 1.0000 | ORAL_TABLET | Freq: Four times a day (QID) | ORAL | 0 refills | Status: DC | PRN

## 2017-04-11 NOTE — Discharge Instructions (Signed)
Please elevate your arm on a pillow and apply ice to the upper arm as discussed.

## 2017-04-11 NOTE — ED Provider Notes (Signed)
[facility]-EM[facility]Y DEPT Provider Note   CSN: [id number] Arrival date & time: 04/11/17  1851 By signing my name below, I, Bridgette Habermann, attest that this documentation has been prepared under the direction and in the presence of Felicie Morn, FNP. Electronically Signed: Bridgette Habermann, ED Scribe. 04/11/17. 8:06 PM.  History   Chief Complaint Chief Complaint  Patient presents with  . Laceration    HPI The history is provided by the patient. No language interpreter was used.   HPI Comments: Francisco Gibson is a 26 y.o. male with no pertinent PMHx, who presents to the Emergency Department complaining of lacerations to his right elbow and right upper arm s/p mechanical injury occurring just PTA. Pt also has associated numbness in his fourth and fifth digits. Pt reports he was working on his car when a clamping machine caught onto his arm. Pt states pain is worsened with bending of the elbow. Bleeding was controlled with a bandage PTA. Pt is not on blood thinners. Denies fever, chills, additional injuries. Tdap UTD on 03/2017.   Past Medical History:  Diagnosis Date  . Otalgia of both ears     There are no active problems to display for this patient.   History reviewed. No pertinent surgical history.     Home Medications    Prior to Admission medications   Medication Sig Start Date End Date Taking? Authorizing Provider  ciprofloxacin-hydrocortisone (CIPRO [facility]) otic suspension Place 3 drops into the left ear 2 (two) times daily. 02/25/17   Georgiana Shore, PA-C  cyclobenzaprine (FLEXERIL) 10 MG tablet Take 1 tablet (10 mg total) by mouth 3 (three) times daily as needed for muscle spasms. 07/27/16   Rise Mu, PA-C  HYDROcodone-acetaminophen (NORCO/VICODIN) 5-325 MG per tablet Take 1 tablet by mouth every 4 (four) hours as needed for moderate pain or severe pain. 08/15/14   Blake Divine, MD  prednisoLONE acetate (PRED FORTE) 1 % ophthalmic suspension Place 2 drops into the left eye 4 (four)  times daily. 08/15/14   Blake Divine, MD    Family History No family history on file.  Social History Social History  Substance Use Topics  . Smoking status: Never Smoker  . Smokeless tobacco: Never Used  . Alcohol use Yes     Comment: ocassional     Allergies   Patient has no known allergies.   Review of Systems Review of Systems  Constitutional: Negative for chills and fever.  Musculoskeletal: Positive for arthralgias and myalgias.  Skin: Positive for wound.  Neurological: Positive for numbness.  All other systems reviewed and are negative.  Physical Exam Updated Vital Signs BP 140/80 (BP Location: Left Arm)   Pulse 81   Temp 98.6 F (37 C) (Oral)   Resp 16   SpO2 98%   Physical Exam  Constitutional: He appears well-developed and well-nourished.  HENT:  Head: Normocephalic.  Eyes: Conjunctivae are normal.  Cardiovascular: Normal rate.   Pulmonary/Chest: Effort normal. No respiratory distress.  Abdominal: He exhibits no distension.  Musculoskeletal:       Right elbow: He exhibits decreased range of motion, swelling and laceration.       Right upper arm: He exhibits tenderness, swelling and laceration.       Arms: Full thickness laceration to the posterior aspect of the right elbow. A laceration overlying the triceps on the anterior surface with swelling noted to the muscle. Limited ROM of elbow d/t pain. Distal pulses and motor function intact.  Paresthesia to right little and ring  finger.  Neurological: He is alert.  Skin: Skin is warm and dry.  Psychiatric: He has a normal mood and affect. His behavior is normal.  Nursing note and vitals reviewed.    ED Treatments / Results  DIAGNOSTIC STUDIES: Oxygen Saturation is 98% on RA, normal by my interpretation.   COORDINATION OF CARE: 8:06 PM-Discussed next steps with pt. Pt verbalized understanding and is agreeable with the plan.   Labs (all labs ordered are listed, but only abnormal results are  displayed) Labs Reviewed - No data to display  EKG  EKG Interpretation None       Radiology Dg Elbow 2 Views Right  Result Date: 04/11/2017 CLINICAL DATA:  Posterior right elbow and right upper arm laceration. EXAM: RIGHT ELBOW - 2 VIEW COMPARISON:  None. FINDINGS: There is no evidence of fracture, dislocation, or joint effusion. There is no evidence of arthropathy or other focal bone abnormality. There is soft tissue swelling with air consistent with laceration in the soft tissues of distal right upper arm. IMPRESSION: No acute bony abnormality.  Soft tissue laceration as described. Electronically Signed   By: Sherian Rein M.D.   On: 04/11/2017 21:06   Dg Humerus Right  Result Date: 04/11/2017 CLINICAL DATA:  Posterior right elbow and right upper arm laceration. EXAM: RIGHT HUMERUS - 2+ VIEW COMPARISON:  None. FINDINGS: There is no evidence of fracture or other focal bone lesions. Soft tissue swelling with tear consistent with history of soft tissue laceration of the distal right upper arm. IMPRESSION: No bony abnormality identified. Soft tissue laceration as described. Electronically Signed   By: Sherian Rein M.D.   On: 04/11/2017 21:07    Procedures .Marland KitchenLaceration Repair Date/Time: 04/11/2017 10:43 PM Performed by: Katrinka Blazing, Chantia Amalfitano Authorized by: Katrinka Blazing, Demetria Iwai   Consent:    Consent obtained:  Verbal   Consent given by:  Patient Universal protocol:    Procedure explained and questions answered to patient or proxy's satisfaction: yes     Relevant documents present and verified: yes     Test results available and properly labeled: yes     Imaging studies available: yes     Required blood products, implants, devices, and special equipment available: yes     Site/side marked: yes     Immediately prior to procedure, a time out was called: yes   Anesthesia (see MAR for exact dosages):    Anesthesia method:  Local infiltration   Local anesthetic:  Lidocaine 1% w/o epi Laceration details:     Location:  Shoulder/arm   Shoulder/arm location:  R elbow   Length (cm):  5 Repair type:    Repair type:  Simple Pre-procedure details:    Preparation:  Patient was prepped and draped in usual sterile fashion and imaging obtained to evaluate for foreign bodies Exploration:    Wound exploration: wound explored through full range of motion and entire depth of wound probed and visualized   Treatment:    Area cleansed with:  Betadine and saline   Amount of cleaning:  Standard   Irrigation solution:  Sterile saline   Irrigation method:  Syringe   Visualized foreign bodies/material removed: no   Skin repair:    Repair method:  Sutures   Suture size:  4-0   Suture material:  Prolene   Suture technique:  Simple interrupted   Number of sutures:  10 Approximation:    Approximation:  Close Post-procedure details:    Dressing:  Antibiotic ointment and sterile dressing   Patient  tolerance of procedure:  Tolerated well, no immediate complications  .Marland Kitchen.Laceration Repair Date/Time: 04/11/2017 11:00 PM Performed by: Katrinka BlazingSMITH, Lorielle Boehning Authorized by: Katrinka BlazingSMITH, Keslie Gritz   Consent:    Consent obtained:  Verbal   Consent given by:  Patient Universal protocol:    Procedure explained and questions answered to patient or proxy's satisfaction: yes     Relevant documents present and verified: yes     Test results available and properly labeled: yes     Imaging studies available: yes     Required blood products, implants, devices, and special equipment available: yes     Site/side marked: yes     Immediately prior to procedure, a time out was called: yes     Patient identity confirmed:  Verbally with patient and arm band Anesthesia (see MAR for exact dosages):    Anesthesia method:  Local infiltration   Local anesthetic:  Lidocaine 1% w/o epi Laceration details:    Location:  Shoulder/arm   Shoulder/arm location:  R upper arm   Length (cm):  2 Repair type:    Repair type:  Simple Pre-procedure details:     Preparation:  Patient was prepped and draped in usual sterile fashion and imaging obtained to evaluate for foreign bodies Exploration:    Wound exploration: wound explored through full range of motion and entire depth of wound probed and visualized     Contaminated: no   Treatment:    Area cleansed with:  Betadine and saline   Amount of cleaning:  Standard   Irrigation solution:  Sterile saline   Irrigation method:  Syringe   Visualized foreign bodies/material removed: no   Skin repair:    Repair method:  Sutures   Suture size:  4-0   Suture material:  Prolene   Suture technique:  Simple interrupted   Number of sutures:  4 Approximation:    Approximation:  Close Post-procedure details:    Dressing:  Antibiotic ointment and sterile dressing   Patient tolerance of procedure:  Tolerated well, no immediate complications   Medications Ordered in ED Medications  oxyCODONE-acetaminophen (PERCOCET/ROXICET) 5-325 MG per tablet 1 tablet (not administered)     Initial Impression / Assessment and Plan / ED Course  I have reviewed the triage vital signs and the nursing notes.  Pertinent labs & imaging results that were available during my care of the patient were reviewed by me and considered in my medical decision making (see chart for details).     Patient discussed with and seen by Dr. Denton LankSteinl.  Consult with hand ([facility])--as injury is at/above the elbow, referral/consult made with orthopedics Lajoyce Corners([facility]).  Wounds repaired, sling, antibiotic, follow-up in office.  Tetanus UTD. Laceration occurred < 12 hours prior to repair. Discussed laceration care with pt and answered questions. Pt to f-u with orthopedics. Suture removal in 10 days and wound check sooner should there be signs of dehiscence or infection. Pt is hemodynamically stable with no complaints prior to dc.    Final Clinical Impressions(s) / ED Diagnoses   Final diagnoses:  Elbow laceration, right, initial encounter  Right  hand paresthesia  Laceration of right upper extremity, initial encounter  Crushing injury of right upper extremity, initial encounter    New Prescriptions Discharge Medication List as of 04/12/2017 12:02 AM    START taking these medications   Details  cephALEXin (KEFLEX) 500 MG capsule Take 1 capsule (500 mg total) by mouth 4 (four) times daily., Starting Mon 04/11/2017, Print    oxyCODONE-acetaminophen (PERCOCET/ROXICET) 5-325  MG tablet Take 1 tablet by mouth every 6 (six) hours as needed for severe pain., Starting Mon 04/11/2017, Print       I personally performed the services described in this documentation, which was scribed in my presence. The recorded information has been reviewed and is accurate.     Felicie Morn, NP 04/12/17 [id number]    Cathren Laine, MD 04/18/17 [phone]

## 2017-04-11 NOTE — ED Triage Notes (Signed)
Pt reports to the ED for eval of laceration to right elbow and right upper arm. He states that he was working on his car when an object inside the car cut his arm. Bleeding controlled at this time. He is not on any blood thinners his tetanus shot was updated in 03/2017.

## 2017-04-11 NOTE — ED Notes (Signed)
Repaged Hand(thompson)-

## 2017-05-06 ENCOUNTER — Emergency Department (HOSPITAL_COMMUNITY)
Admission: EM | Admit: 2017-05-06 | Discharge: 2017-05-06 | Disposition: A | Discharge status: Home / Self Care | Payer: Self-pay | Attending: Emergency Medicine | Admitting: Emergency Medicine

## 2017-05-06 ENCOUNTER — Encounter (HOSPITAL_COMMUNITY): Payer: Self-pay

## 2017-05-06 DIAGNOSIS — Y939 Activity, unspecified: Secondary | ICD-10-CM | POA: Insufficient documentation

## 2017-05-06 DIAGNOSIS — W57XXXA Bitten or stung by nonvenomous insect and other nonvenomous arthropods, initial encounter: Secondary | ICD-10-CM | POA: Insufficient documentation

## 2017-05-06 DIAGNOSIS — Y999 Unspecified external cause status: Secondary | ICD-10-CM | POA: Insufficient documentation

## 2017-05-06 DIAGNOSIS — Y929 Unspecified place or not applicable: Secondary | ICD-10-CM | POA: Insufficient documentation

## 2017-05-06 DIAGNOSIS — S40862A Insect bite (nonvenomous) of left upper arm, initial encounter: Secondary | ICD-10-CM | POA: Insufficient documentation

## 2017-05-06 NOTE — ED Triage Notes (Signed)
Pt complaining of tick on L arm. Pt states already removed tick. Pt denies any fevers/chills. Pt states hx of same. Pt with no other complaints.

## 2017-05-06 NOTE — Discharge Instructions (Signed)
I am giving you information regarding tick bites and RMSF. If you develop any of the symptoms return here for evaluation.

## 2017-05-06 NOTE — ED Provider Notes (Signed)
[facility] Provider Note   CSN: [id number] Arrival date & time: 05/06/17  1530   By signing my name below, I, Freida Busmaniana Omoyeni, attest that this documentation has been prepared under the direction and in the presence of Kerrie BuffaloHope Neese, NP. Electronically Signed: Freida Busmaniana Omoyeni, Scribe. 05/06/2017. 4:46 PM.   History   Chief Complaint Chief Complaint  Patient presents with  . Tick Removal    The history is provided by the patient. No language interpreter was used.    HPI Comments:  Francisco Gibson is a 26 y.o. male who presents to the Emergency Department complaining of a tick bite to left upper arm which he first noticed ~ 1200 today. He states he removed the tick PTA and the tick was still moving. 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 Pt is asymptomatic at this time and has no other acute complaints or associated symptoms. He denies fever, chills, HA, nausea, vomiting, and vision changes.   Past Medical History:  Diagnosis Date  . Otalgia of both ears     There are no active problems to display for this patient.   History reviewed. No pertinent surgical history.     Home Medications    Prior to Admission medications   Medication Sig Start Date End Date Taking? Authorizing Provider  cephALEXin (KEFLEX) 500 MG capsule Take 1 capsule (500 mg total) by mouth 4 (four) times daily. 04/11/17   Felicie MornSmith, David, NP  ciprofloxacin-hydrocortisone ([facility]) otic suspension Place 3 drops into the left ear 2 (two) times daily. 02/25/17   Georgiana ShoreMitchell, Jessica B, PA-C  cyclobenzaprine (FLEXERIL) 10 MG tablet Take 1 tablet (10 mg total) by mouth 3 (three) times daily as needed for muscle spasms. 07/27/16   Rise MuLeaphart, Kenneth T, PA-C  HYDROcodone-acetaminophen (NORCO/VICODIN) 5-325 MG per tablet Take 1 tablet by mouth every 4 (four) hours as needed for moderate pain or severe pain. 08/15/14   Blake DivineWofford, John, MD    oxyCODONE-acetaminophen (PERCOCET/ROXICET) 5-325 MG tablet Take 1 tablet by mouth every 6 (six) hours as needed for severe pain. 04/11/17   Felicie MornSmith, David, NP  prednisoLONE acetate (PRED FORTE) 1 % ophthalmic suspension Place 2 drops into the left eye 4 (four) times daily. 08/15/14   Blake DivineWofford, John, MD    Family History History reviewed. No pertinent family history.  Social History Social History  Substance Use Topics  . Smoking status: Never Smoker  . Smokeless tobacco: Never Used  . Alcohol use Yes     Comment: ocassional     Allergies   Patient has no known allergies.   Review of Systems Review of Systems  Constitutional: Negative for chills and fever.  HENT: Negative.   Eyes: Negative for visual disturbance.  Gastrointestinal: Negative for abdominal pain, nausea and vomiting.  Musculoskeletal: Negative for arthralgias.  Skin: Negative for rash.       +tick bite  Neurological: Negative for headaches.  Psychiatric/Behavioral: Negative for confusion. The patient is not nervous/anxious.      Physical Exam Updated Vital Signs BP (!) 137/96   Pulse 98   Temp 99.2 F (37.3 C) (Oral)   Resp 16   Ht 6' (1.829 m)   Wt 79.4 kg (175 lb)   SpO2 97%   BMI 23.73 kg/m   Physical Exam  Constitutional: He is oriented to person, place, and time. He appears well-developed and well-nourished. No distress.  HENT:  Head: Normocephalic and  atraumatic.  Eyes: Conjunctivae are normal.  Neck: Neck supple.  Cardiovascular: Normal rate.   Pulmonary/Chest: Effort normal.  Neurological: He is alert and oriented to person, place, and time.  Skin: Skin is warm and dry.  Left upper arm near the axilla there is 0.5 cm raised area that is nontender where pt removed a tick today; no red streaking, erythema or signs of infection  No axillary lymphadenopathy or tenderness.   Psychiatric: He has a normal mood and affect. His behavior is normal.  Nursing note and vitals reviewed.    ED  Treatments / Results  DIAGNOSTIC STUDIES:  Oxygen Saturation is 97% on RA, normal by my interpretation.    COORDINATION OF CARE:  4:46 PM Discussed treatment plan with pt at bedside and pt agreed to plan.  Labs (all labs ordered are listed, but only abnormal results are displayed) Labs Reviewed - No data to display  EKG  EKG Interpretation None       Radiology No results found.  Procedures Procedures (including critical care time)  Medications Ordered in ED Medications - No data to display   Initial Impression / Assessment and Plan / ED Course  I have reviewed the triage vital signs and the nursing notes.  Final Clinical Impressions(s) / ED Diagnoses  26 y.o. male here for check up after he removed a tick form his left upper arm earlier today. Stable for d/c without any symptoms of RMSF. Discussed with patient return precautions. Patient has had RMSF in the patient and will return if he develops any symptoms.   Final diagnoses:  Tick bite, initial encounter    New Prescriptions Discharge Medication List as of 05/06/2017  4:47 PM    I personally performed the services described in this documentation, which was scribed in my presence. The recorded information has been reviewed and is accurate.    Kerrie Buffalo [place], [place] 05/06/17 Berna Spare    Derwood Kaplan, MD 05/08/17 [phone]

## 2017-07-29 ENCOUNTER — Emergency Department (HOSPITAL_COMMUNITY)
Admission: EM | Admit: 2017-07-29 | Discharge: 2017-07-29 | Discharge status: Against Medical Advice | Payer: No Typology Code available for payment source

## 2017-07-29 ENCOUNTER — Encounter (HOSPITAL_COMMUNITY): Payer: Self-pay | Admitting: Emergency Medicine

## 2017-07-29 DIAGNOSIS — R112 Nausea with vomiting, unspecified: Secondary | ICD-10-CM | POA: Insufficient documentation

## 2017-07-29 DIAGNOSIS — R197 Diarrhea, unspecified: Secondary | ICD-10-CM | POA: Insufficient documentation

## 2017-07-29 LAB — COMPREHENSIVE METABOLIC PANEL
ALT: 34 U/L (ref 17–63)
ANION GAP: 20 — AB (ref 5–15)
AST: 32 U/L (ref 15–41)
Albumin: 5.2 g/dL — ABNORMAL HIGH (ref 3.5–5.0)
Alkaline Phosphatase: 105 U/L (ref 38–126)
BILIRUBIN TOTAL: 1.2 mg/dL (ref 0.3–1.2)
BUN: 13 mg/dL (ref 6–20)
CO2: 21 mmol/L — ABNORMAL LOW (ref 22–32)
Calcium: 10 mg/dL (ref 8.9–10.3)
Chloride: 102 mmol/L (ref 101–111)
Creatinine, Ser: 0.99 mg/dL (ref 0.61–1.24)
Glucose, Bld: 148 mg/dL — ABNORMAL HIGH (ref 65–99)
POTASSIUM: 3.7 mmol/L (ref 3.5–5.1)
Sodium: 143 mmol/L (ref 135–145)
TOTAL PROTEIN: 9.1 g/dL — AB (ref 6.5–8.1)

## 2017-07-29 LAB — LIPASE, BLOOD: Lipase: 23 U/L (ref 11–51)

## 2017-07-29 LAB — CBC
HEMATOCRIT: 51.6 % (ref 39.0–52.0)
HEMOGLOBIN: 17.3 g/dL — AB (ref 13.0–17.0)
MCH: 28.6 pg (ref 26.0–34.0)
MCHC: 33.5 g/dL (ref 30.0–36.0)
MCV: 85.3 fL (ref 78.0–100.0)
Platelets: 348 10*3/uL (ref 150–400)
RBC: 6.05 MIL/uL — AB (ref 4.22–5.81)
RDW: 14.1 % (ref 11.5–15.5)
WBC: 12.4 10*3/uL — AB (ref 4.0–10.5)

## 2017-07-29 MED ORDER — FENTANYL CITRATE (PF) 100 MCG/2ML IJ SOLN
50.0000 ug | INTRAMUSCULAR | Status: DC | PRN
  Administered 2017-07-29: 50 ug via INTRAVENOUS

## 2017-07-29 MED ORDER — FENTANYL CITRATE (PF) 100 MCG/2ML IJ SOLN
INTRAMUSCULAR | Status: AC
  Filled 2017-07-29: qty 2

## 2017-07-29 MED ORDER — ONDANSETRON HCL 4 MG/2ML IJ SOLN
INTRAMUSCULAR | Status: AC
  Filled 2017-07-29: qty 2

## 2017-07-29 MED ORDER — ONDANSETRON HCL 4 MG/2ML IJ SOLN
4.0000 mg | Freq: Once | INTRAMUSCULAR | Status: AC | PRN
  Administered 2017-07-29: 4 mg via INTRAVENOUS

## 2017-07-29 NOTE — ED Triage Notes (Signed)
Reports sudden onset n/v/d at 6pm.  Reports vomiting atleast 20 times since starting.

## 2017-07-30 ENCOUNTER — Emergency Department (HOSPITAL_COMMUNITY)
Admission: EM | Admit: 2017-07-30 | Discharge: 2017-07-30 | Disposition: A | Discharge status: Home / Self Care | Payer: Self-pay | Attending: Emergency Medicine | Admitting: Emergency Medicine

## 2017-07-30 DIAGNOSIS — R197 Diarrhea, unspecified: Secondary | ICD-10-CM

## 2017-07-30 DIAGNOSIS — R112 Nausea with vomiting, unspecified: Secondary | ICD-10-CM

## 2017-07-30 LAB — URINALYSIS, ROUTINE W REFLEX MICROSCOPIC
BILIRUBIN URINE: NEGATIVE
Bacteria, UA: NONE SEEN
Glucose, UA: 50 mg/dL — AB
Ketones, ur: 20 mg/dL — AB
LEUKOCYTES UA: NEGATIVE
Nitrite: NEGATIVE
Protein, ur: >=300 mg/dL — AB
Specific Gravity, Urine: 1.029 (ref 1.005–1.030)
pH: 6 (ref 5.0–8.0)

## 2017-07-30 MED ORDER — ONDANSETRON HCL 4 MG/2ML IJ SOLN
4.0000 mg | Freq: Once | INTRAMUSCULAR | Status: AC
  Administered 2017-07-30: 4 mg via INTRAVENOUS
  Filled 2017-07-30: qty 2

## 2017-07-30 MED ORDER — LOPERAMIDE HCL 2 MG PO CAPS: 2.0000 mg | ORAL_CAPSULE | Freq: Four times a day (QID) | ORAL | 0 refills | Status: AC | PRN

## 2017-07-30 MED ORDER — DIPHENHYDRAMINE HCL 50 MG/ML IJ SOLN
25.0000 mg | Freq: Once | INTRAMUSCULAR | Status: AC
  Administered 2017-07-30: 25 mg via INTRAVENOUS
  Filled 2017-07-30: qty 1

## 2017-07-30 MED ORDER — METOCLOPRAMIDE HCL 5 MG/ML IJ SOLN
10.0000 mg | Freq: Once | INTRAMUSCULAR | Status: AC
  Administered 2017-07-30: 10 mg via INTRAVENOUS
  Filled 2017-07-30: qty 2

## 2017-07-30 MED ORDER — PROMETHAZINE HCL 25 MG PO TABS: 25.0000 mg | ORAL_TABLET | Freq: Four times a day (QID) | ORAL | 0 refills | Status: AC | PRN

## 2017-07-30 MED ORDER — ACETAMINOPHEN 325 MG PO TABS
ORAL_TABLET | ORAL | Status: AC
  Filled 2017-07-30: qty 2

## 2017-07-30 MED ORDER — ACETAMINOPHEN 325 MG PO TABS
650.0000 mg | ORAL_TABLET | Freq: Once | ORAL | Status: AC
  Administered 2017-07-30: 650 mg via ORAL

## 2017-07-30 MED ORDER — SODIUM CHLORIDE 0.9 % IV BOLUS (SEPSIS)
1000.0000 mL | Freq: Once | INTRAVENOUS | Status: AC
  Administered 2017-07-30: 1000 mL via INTRAVENOUS

## 2017-07-30 MED ORDER — KETOROLAC TROMETHAMINE 30 MG/ML IJ SOLN
30.0000 mg | Freq: Once | INTRAMUSCULAR | Status: AC
  Administered 2017-07-30: 30 mg via INTRAVENOUS
  Filled 2017-07-30: qty 1

## 2017-07-30 NOTE — ED Provider Notes (Signed)
TIME SEEN: 4:32 AM  CHIEF COMPLAINT: Nausea, vomiting and diarrhea  HPI: Patient is a 26 year old man with no significant past medical history who presents emergency department with nausea, vomiting and diarrhea that started last night at 6 PM. Reports over 20 episodes of nonbloody, nonbilious vomiting and 2 episodes of nonbloody diarrhea. No abdominal pain. States he is having back pain but states he felt like it started after vomiting. No dysuria or hematuria. He denies history of kidney stones. No prior abdominal surgeries. No sick contacts, recent travel, hospitalization, antibiotic use. States he feels lightheaded and having a headache and feels he is dehydrated.  ROS: See HPI Constitutional: no fever  Eyes: no drainage  ENT: no runny nose   Cardiovascular:  no chest pain  Resp: no SOB  GI:  vomiting GU: no dysuria Integumentary: no rash  Allergy: no hives  Musculoskeletal: no leg swelling  Neurological: no slurred speech ROS otherwise negative  PAST MEDICAL HISTORY/PAST SURGICAL HISTORY:  Past Medical History:  Diagnosis Date  . Otalgia of both ears     MEDICATIONS:  Prior to Admission medications   Medication Sig Start Date End Date Taking? Authorizing Provider  cephALEXin (KEFLEX) 500 MG capsule Take 1 capsule (500 mg total) by mouth 4 (four) times daily. 04/11/17   Felicie Morn, NP  ciprofloxacin-hydrocortisone (CIPRO [facility]) otic suspension Place 3 drops into the left ear 2 (two) times daily. 02/25/17   Georgiana Shore, PA-C  cyclobenzaprine (FLEXERIL) 10 MG tablet Take 1 tablet (10 mg total) by mouth 3 (three) times daily as needed for muscle spasms. 07/27/16   Rise Mu, PA-C  HYDROcodone-acetaminophen (NORCO/VICODIN) 5-325 MG per tablet Take 1 tablet by mouth every 4 (four) hours as needed for moderate pain or severe pain. 08/15/14   Blake Divine, MD  oxyCODONE-acetaminophen (PERCOCET/ROXICET) 5-325 MG tablet Take 1 tablet by mouth every 6 (six) hours as needed for  severe pain. 04/11/17   Felicie Morn, NP  prednisoLONE acetate (PRED FORTE) 1 % ophthalmic suspension Place 2 drops into the left eye 4 (four) times daily. 08/15/14   Blake Divine, MD    ALLERGIES:  No Known Allergies  SOCIAL HISTORY:  Social History  Substance Use Topics  . Smoking status: Never Smoker  . Smokeless tobacco: Never Used  . Alcohol use Yes     Comment: ocassional    FAMILY HISTORY: No family history on file.  EXAM: BP (!) 133/105 (BP Location: Right Arm)   Pulse 100   Temp 98 F (36.7 C) (Oral)   Resp 16   Ht 6' (1.829 m)   Wt 79.4 kg (175 lb)   SpO2 97%   BMI 23.73 kg/m  CONSTITUTIONAL: Alert and oriented and responds appropriately to questions. Well-appearing; well-nourished HEAD: Normocephalic EYES: Conjunctivae clear, pupils appear equal, EOMI ENT: normal nose; moist mucous membranes NECK: Supple, no meningismus, no nuchal rigidity, no LAD  CARD: RRR; S1 and S2 appreciated; no murmurs, no clicks, no rubs, no gallops RESP: Normal chest excursion without splinting or tachypnea; breath sounds clear and equal bilaterally; no wheezes, no rhonchi, no rales, no hypoxia or respiratory distress, speaking full sentences ABD/GI: Normal bowel sounds; non-distended; soft, non-tender, no rebound, no guarding, no peritoneal signs, no hepatosplenomegaly BACK:  The back appears normal and is non-tender to palpation, there is no CVA tenderness EXT: Normal ROM in all joints; non-tender to palpation; no edema; normal capillary refill; no cyanosis, no calf tenderness or swelling    SKIN: Normal color for age  and race; warm; no rash NEURO: Moves all extremities equally, Sensation to light touch intact diffusely, normal gait, normal speech, cranial nerves II through XII intact PSYCH: The patient's mood and manner are appropriate. Grooming and personal hygiene are appropriate.  MEDICAL DECISION MAKING: Patient here with nausea, vomiting and 2 episodes of diarrhea. He does appear  dry on exam and his labs appear hemoconcentrated. We'll give IV fluids. Urine shows blood but no other sign of infection.  I question him about kidney stones and he states he has never had one before. He states that the pain in his back is very mild and was not the triggering factor for his vomiting. He states that he thinks he just pulled muscles in his lower back from vomiting so much. He states he does not feel this is a kidney stone and does not want further imaging and I feel that this is an acceptable plan. I suspect viral gastroenteritis. Will hydrate patient. He is complaining of mild headache but no meningismus and no focal neurologic deficits. We'll give Toradol and reassess. Has already been given Tylenol and fentanyl by triage nursing staff.  ED PROGRESS: Patient reports nausea has markedly improved. He has been able to drink without difficulty. Headache is still mildly present he is requesting something else for pain. We'll give Reglan, Benadryl. Anticipate discharge home once headache has improved. Patient comfortable with this plan.   At this time, I do not feel there is any life-threatening condition present. I have reviewed and discussed all results (EKG, imaging, lab, urine as appropriate) and exam findings with patient/family. I have reviewed nursing notes and appropriate previous records.  I feel the patient is safe to be discharged home without further emergent workup and can continue workup as an outpatient as needed. Discussed usual and customary return precautions. Patient/family verbalize understanding and are comfortable with this plan.  Outpatient follow-up has been provided if needed. All questions have been answered.      Ward, Layla Maw, DO 07/30/17 [phone]

## 2017-07-30 NOTE — ED Notes (Addendum)
Patient family updated on reassuring labs and patient's NPO status.  Stating they are going to buy him beverages.  This RN explained rationale for NPO status.

## 2017-07-30 NOTE — Discharge Instructions (Signed)
You may alternate Tylenol 1000 mg every 6 hours as needed for pain and Ibuprofen 800 mg every 8 hours as needed for pain.  Please take Ibuprofen with food. ° ° ° °To find a primary care or specialty doctor please call [phone] or [phone] to access "[facility] Find a Doctor Service." ° °You may also go on the [facility] website at [email or web address] ° °There are also multiple [facility], [facility], [facility] and [facility] practices throughout the [place] that are frequently accepting new patients. You may find a clinic that is close to your home and contact them. ° °[facility] -  °[address] °[place] [address] °[phone] ° ° °[facility] -  °[address] °[place] [address] °[phone] ° ° °[facility] - °[address]  °[place] [address] °[phone] ° ° °

## 2017-07-30 NOTE — ED Notes (Signed)
Performed PO challenge and Pt. Complained of nausea and dizziness but was able to keep the water down. Will continue to monitor.

## 2017-07-30 NOTE — ED Notes (Signed)
Pt c/o 9/10 headache, asking for medicine.  This RN encouraged patient to attempt PO meds due to efficacy for headache.  Pt denies nausea at this time.  This RN encouraged patient to come back to desk if nausea returns after PO Tylenol given.

## 2017-07-30 NOTE — ED Notes (Signed)
Pt. Reports n/v/d since yesterday. Pt. States vomiting 23 times and feels very dehydrated. Pt. Cannot recall eating anything abnormal or doing anything different than his normal routine.

## 2017-10-03 ENCOUNTER — Encounter (HOSPITAL_COMMUNITY): Payer: Self-pay | Admitting: Emergency Medicine

## 2017-10-03 ENCOUNTER — Emergency Department (HOSPITAL_COMMUNITY)
Admission: EM | Admit: 2017-10-03 | Discharge: 2017-10-03 | Disposition: A | Discharge status: Home / Self Care | Payer: BLUE CROSS/BLUE SHIELD | Attending: Emergency Medicine | Admitting: Emergency Medicine

## 2017-10-03 DIAGNOSIS — H1032 Unspecified acute conjunctivitis, left eye: Secondary | ICD-10-CM | POA: Diagnosis not present

## 2017-10-03 DIAGNOSIS — H538 Other visual disturbances: Secondary | ICD-10-CM | POA: Insufficient documentation

## 2017-10-03 DIAGNOSIS — H579 Unspecified disorder of eye and adnexa: Secondary | ICD-10-CM | POA: Diagnosis present

## 2017-10-03 DIAGNOSIS — H109 Unspecified conjunctivitis: Secondary | ICD-10-CM

## 2017-10-03 MED ORDER — ERYTHROMYCIN 5 MG/GM OP OINT: 1.0000 "application " | TOPICAL_OINTMENT | Freq: Four times a day (QID) | OPHTHALMIC | 0 refills | Status: AC

## 2017-10-03 NOTE — ED Triage Notes (Signed)
Pt to ER for left eye pruritus with drainage, states onset this morning. sclera reddened. NAD

## 2017-10-03 NOTE — ED Provider Notes (Signed)
[facility] Provider Note   CSN: [id number] Arrival date & time: 10/03/17  1442     History   Chief Complaint Chief Complaint  Patient presents with  . Conjunctivitis    HPI Francisco Gibson is a 26 y.o. male.  HPI  26 y.o. male, presents to the Emergency Department today due to left eye itching with drainage this morning. Denies pain. No loss of vision. Notes slight blurring. States that his eye was swollen shut this morning and gradually released. No fevers. No N/V. No headaches. No meds PTA. No sick contacts. No other symptoms noted.    Past Medical History:  Diagnosis Date  . Otalgia of both ears     There are no active problems to display for this patient.   History reviewed. No pertinent surgical history.     Home Medications    Prior to Admission medications   Medication Sig Start Date End Date Taking? Authorizing Provider  loperamide (IMODIUM) 2 MG capsule Take 1 capsule (2 mg total) by mouth 4 (four) times daily as needed for diarrhea or loose stools. 07/30/17   Ward, Layla MawKristen N, DO  prednisoLONE acetate (PRED FORTE) 1 % ophthalmic suspension Place 2 drops into the left eye 4 (four) times daily. Patient not taking: Reported on 07/30/2017 08/15/14   Blake DivineWofford, John, MD  promethazine (PHENERGAN) 25 MG tablet Take 1 tablet (25 mg total) by mouth every 6 (six) hours as needed for nausea or vomiting. 07/30/17   Ward, Layla MawKristen N, DO    Family History History reviewed. No pertinent family history.  Social History Social History  Substance Use Topics  . Smoking status: Never Smoker  . Smokeless tobacco: Never Used  . Alcohol use Yes     Comment: ocassional     Allergies   Patient has no known allergies.   Review of Systems Review of Systems ROS reviewed and all are negative for acute change except as noted in the HPI.  Physical Exam Updated Vital Signs BP 125/77 (BP Location: Left Arm)   Pulse 86   Temp 98.2 F (36.8  C) (Oral)   Resp 18   Ht 6' (1.829 m)   Wt 87.1 kg (192 lb)   SpO2 96%   BMI 26.04 kg/m   Physical Exam  Constitutional: He is oriented to person, place, and time. Vital signs are normal. He appears well-developed and well-nourished.  HENT:  Head: Normocephalic.  Right Ear: Hearing normal.  Left Ear: Hearing normal.  Left eye with injected conjunctiva. No periorbital edema. EOM intact without pain. Pupils equal and reactive to light   Eyes: Pupils are equal, round, and reactive to light. Conjunctivae and EOM are normal.  Cardiovascular: Normal rate and regular rhythm.   Pulmonary/Chest: Effort normal.  Neurological: He is alert and oriented to person, place, and time.  Skin: Skin is warm and dry.  Psychiatric: He has a normal mood and affect. His speech is normal and behavior is normal. Thought content normal.  Nursing note and vitals reviewed.    ED Treatments / Results  Labs (all labs ordered are listed, but only abnormal results are displayed) Labs Reviewed - No data to display  EKG  EKG Interpretation None       Radiology No results found.  Procedures Procedures (including critical care time)  Medications Ordered in ED Medications - No data to display   Initial Impression / Assessment and Plan / ED Course  I have reviewed the triage vital  signs and the nursing notes.  Pertinent labs & imaging results that were available during my care of the patient were reviewed by me and considered in my medical decision making (see chart for details).  Final Clinical Impressions(s) / ED Diagnoses     {I have reviewed the relevant previous healthcare records.  {I obtained HPI from historian.   ED Course:  Assessment: Pt is a 26 y.o. male presents to the Emergency Department today due to left eye itching with drainage this morning. Denies pain. No loss of vision. Notes slight blurring. States that his eye was swollen shut this morning and gradually released. No fevers.  No N/V. No headaches. No meds PTA. No sick contacts. . On exam, pt in NAD. Nontoxic/nonseptic appearing. VSS. Afebrile. Left eye with injected conjunctiva. No periorbital edema. EOM intact without pain. Pupils equal and reactive to light. Will treat for bacterial conjunctivitis. Home care instructions discussed. Rx ABX drops. Plan is to DC home with follow up to PCP. At time of discharge, Patient is in no acute distress. Vital Signs are stable. Patient is able to ambulate. Patient able to tolerate PO.   Disposition/Plan:  DC Home Additional Verbal discharge instructions given and discussed with patient.  Pt Instructed to f/u with PCP in the next week for evaluation and treatment of symptoms. Return precautions given Pt acknowledges and agrees with plan  Supervising Physician Pricilla Loveless, MD  Final diagnoses:  Bacterial conjunctivitis of left eye    New Prescriptions New Prescriptions   No medications on file     Audry Pili, Cordelia Poche 10/03/17 1714    Pricilla Loveless, MD 10/03/17 2029

## 2017-10-03 NOTE — ED Notes (Signed)
Patient Alert and oriented X4. Stable and ambulatory. Patient verbalized understanding of the discharge instructions.  Patient belongings were taken by the patient.  
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
## 2017-10-20 ENCOUNTER — Encounter (HOSPITAL_COMMUNITY): Payer: Self-pay

## 2017-10-20 ENCOUNTER — Emergency Department (HOSPITAL_COMMUNITY)
Admission: EM | Admit: 2017-10-20 | Discharge: 2017-10-21 | Disposition: A | Discharge status: Home / Self Care | Payer: BLUE CROSS/BLUE SHIELD | Attending: Emergency Medicine | Admitting: Emergency Medicine

## 2017-10-20 ENCOUNTER — Other Ambulatory Visit: Payer: Self-pay

## 2017-10-20 DIAGNOSIS — E860 Dehydration: Secondary | ICD-10-CM

## 2017-10-20 DIAGNOSIS — F17200 Nicotine dependence, unspecified, uncomplicated: Secondary | ICD-10-CM | POA: Diagnosis not present

## 2017-10-20 DIAGNOSIS — R1110 Vomiting, unspecified: Secondary | ICD-10-CM | POA: Diagnosis present

## 2017-10-20 LAB — COMPREHENSIVE METABOLIC PANEL
ALBUMIN: 5 g/dL (ref 3.5–5.0)
ALT: 29 U/L (ref 17–63)
ANION GAP: 20 — AB (ref 5–15)
AST: 34 U/L (ref 15–41)
Alkaline Phosphatase: 135 U/L — ABNORMAL HIGH (ref 38–126)
BILIRUBIN TOTAL: 1.5 mg/dL — AB (ref 0.3–1.2)
BUN: 12 mg/dL (ref 6–20)
CO2: 24 mmol/L (ref 22–32)
Calcium: 9.9 mg/dL (ref 8.9–10.3)
Chloride: 95 mmol/L — ABNORMAL LOW (ref 101–111)
Creatinine, Ser: 1.24 mg/dL (ref 0.61–1.24)
GFR calc non Af Amer: >60 mL/min (ref >60)
GLUCOSE: 139 mg/dL — AB (ref 65–99)
POTASSIUM: 3.4 mmol/L — AB (ref 3.5–5.1)
SODIUM: 139 mmol/L (ref 135–145)
TOTAL PROTEIN: 8.9 g/dL — AB (ref 6.5–8.1)

## 2017-10-20 LAB — CBC
HEMATOCRIT: 51.6 % (ref 39.0–52.0)
HEMOGLOBIN: 17.6 g/dL — AB (ref 13.0–17.0)
MCH: 29.7 pg (ref 26.0–34.0)
MCHC: 34.1 g/dL (ref 30.0–36.0)
MCV: 87 fL (ref 78.0–100.0)
Platelets: 325 10*3/uL (ref 150–400)
RBC: 5.93 MIL/uL — AB (ref 4.22–5.81)
RDW: 13.6 % (ref 11.5–15.5)
WBC: 12.1 10*3/uL — ABNORMAL HIGH (ref 4.0–10.5)

## 2017-10-20 LAB — LIPASE, BLOOD: Lipase: 16 U/L (ref 11–51)

## 2017-10-20 LAB — I-STAT CG4 LACTIC ACID, ED: Lactic Acid, Venous: 4.22 mmol/L (ref 0.5–1.9)

## 2017-10-20 MED ORDER — ONDANSETRON 4 MG PO TBDP
4.0000 mg | ORAL_TABLET | Freq: Once | ORAL | Status: AC | PRN
  Administered 2017-10-20: 4 mg via ORAL
  Filled 2017-10-20: qty 1

## 2017-10-20 MED ORDER — SODIUM CHLORIDE 0.9 % IV BOLUS (SEPSIS)
2000.0000 mL | Freq: Once | INTRAVENOUS | Status: AC
  Administered 2017-10-20: 2000 mL via INTRAVENOUS

## 2017-10-20 MED ORDER — CAPSAICIN 0.025 % EX CREA
TOPICAL_CREAM | Freq: Once | CUTANEOUS | Status: AC
  Administered 2017-10-20: via TOPICAL
  Filled 2017-10-20: qty 60

## 2017-10-20 MED ORDER — ONDANSETRON HCL 4 MG/2ML IJ SOLN
4.0000 mg | Freq: Once | INTRAMUSCULAR | Status: AC
  Administered 2017-10-20: 4 mg via INTRAVENOUS
  Filled 2017-10-20: qty 2

## 2017-10-20 NOTE — ED Notes (Signed)
NF Callie, CN [place] and Dr Ranae PalmsYelverton informed of lactic acid results 4.22

## 2017-10-20 NOTE — ED Provider Notes (Signed)
[facility] Provider Note   CSN: [id number] Arrival date & time: 10/20/17  2107     History   Chief Complaint Chief Complaint  Patient presents with  . Emesis    HPI Francisco Gibson is a 26 y.o. male.  HPI Patient presents with multiple episodes of vomiting starting this morning.  No blood in the vomit.  Denies abdominal pain.  Denies diarrhea.  No known sick contacts.  Has been seen in the emergency department in the past for similar symptoms.  Past Medical History:  Diagnosis Date  . Otalgia of both ears     There are no active problems to display for this patient.   History reviewed. No pertinent surgical history.     Home Medications    Prior to Admission medications   Medication Sig Start Date End Date Taking? Authorizing Provider  erythromycin [facility](ROMYCIN) ophthalmic ointment Place 1 application into the left eye 4 (four) times daily. FOUR TIMES PER DAY for FIVE DAYS 10/03/17   Audry PiliMohr, Tyler, PA-C  loperamide (IMODIUM) 2 MG capsule Take 1 capsule (2 mg total) by mouth 4 (four) times daily as needed for diarrhea or loose stools. 07/30/17   Ward, Layla MawKristen N, DO  prednisoLONE acetate (PRED FORTE) 1 % ophthalmic suspension Place 2 drops into the left eye 4 (four) times daily. Patient not taking: Reported on 07/30/2017 08/15/14   Blake DivineWofford, John, MD  promethazine (PHENERGAN) 25 MG tablet Take 1 tablet (25 mg total) by mouth every 6 (six) hours as needed for nausea or vomiting. 07/30/17   Ward, Layla MawKristen N, DO    Family History History reviewed. No pertinent family history.  Social History Social History   Tobacco Use  . Smoking status: Current Every Day Smoker    Packs/day: 0.50  . Smokeless tobacco: Never Used  Substance Use Topics  . Alcohol use: Yes    Comment: ocassional  . Drug use: No     Allergies   Patient has no known allergies.   Review of Systems Review of Systems  Constitutional: Positive for fatigue. Negative  for chills and fever.  Respiratory: Negative for shortness of breath and wheezing.   Cardiovascular: Negative for chest pain, palpitations and leg swelling.  Gastrointestinal: Positive for nausea and vomiting. Negative for abdominal pain, constipation and diarrhea.  Musculoskeletal: Negative for back pain, myalgias, neck pain and neck stiffness.  Skin: Negative for rash and wound.  Neurological: Positive for light-headedness and headaches. Negative for dizziness, weakness and numbness.  All other systems reviewed and are negative.    Physical Exam Updated Vital Signs BP 116/62   Pulse 83   Temp 98 F (36.7 C)   Resp 18   Ht 6' (1.829 m)   Wt 87.1 kg (192 lb)   SpO2 97%   BMI 26.04 kg/m   Physical Exam  Constitutional: He is oriented to person, place, and time. He appears well-developed and well-nourished. No distress.  HENT:  Head: Normocephalic and atraumatic.  Mouth/Throat: Oropharynx is clear and moist. No oropharyngeal exudate.  Eyes: EOM are normal. Pupils are equal, round, and reactive to light.  Neck: Normal range of motion. Neck supple.  Cardiovascular: Normal rate and regular rhythm.  Pulmonary/Chest: Effort normal and breath sounds normal.  Abdominal: Soft. Bowel sounds are normal. There is no tenderness. There is no rebound and no guarding.  Musculoskeletal: Normal range of motion. He exhibits no edema or tenderness.  Neurological: He is alert and oriented to person, place, and  time.  Moves all extremities without deficit.  Sensation fully intact.  Skin: Skin is warm and dry. Capillary refill takes less than 2 seconds. No rash noted. He is not diaphoretic. No erythema.  Psychiatric: He has a normal mood and affect. His behavior is normal.  Nursing note and vitals reviewed.    ED Treatments / Results  Labs (all labs ordered are listed, but only abnormal results are displayed) Labs Reviewed  COMPREHENSIVE METABOLIC PANEL - Abnormal; Notable for the following  components:      Result Value   Potassium 3.4 (*)    Chloride 95 (*)    Glucose, Bld 139 (*)    Total Protein 8.9 (*)    Alkaline Phosphatase 135 (*)    Total Bilirubin 1.5 (*)    Anion gap 20 (*)    All other components within normal limits  CBC - Abnormal; Notable for the following components:   WBC 12.1 (*)    RBC 5.93 (*)    Hemoglobin 17.6 (*)    All other components within normal limits  I-STAT CG4 LACTIC ACID, ED - Abnormal; Notable for the following components:   Lactic Acid, Venous 4.22 (*)    All other components within normal limits  LIPASE, BLOOD  URINALYSIS, ROUTINE W REFLEX MICROSCOPIC  I-STAT CG4 LACTIC ACID, ED    EKG  EKG Interpretation None       Radiology No results found.  Procedures Procedures (including critical care time)  Medications Ordered in ED Medications  capsaicin (ZOSTRIX) 0.025 % cream (not administered)  ondansetron (ZOFRAN-ODT) disintegrating tablet 4 mg (4 mg Oral Given 10/20/17 2125)  sodium chloride 0.9 % bolus 2,000 mL (2,000 mLs Intravenous New Bag/Given 10/20/17 2254)  ondansetron (ZOFRAN) injection 4 mg (4 mg Intravenous Given 10/20/17 2257)     Initial Impression / Assessment and Plan / ED Course  I have reviewed the triage vital signs and the nursing notes.  Pertinent labs & imaging results that were available during my care of the patient were reviewed by me and considered in my medical decision making (see chart for details).     Patient admits to heavy alcohol use last night and occasional marijuana use. Will sign out to oncoming EDP pending reeval after IVF.  Final Clinical Impressions(s) / ED Diagnoses   Final diagnoses:  Hyperemesis  Dehydration    ED Discharge Orders    None       Loren RacerYelverton, Koston Hennes, MD 10/25/17 1237

## 2017-10-20 NOTE — ED Triage Notes (Signed)
Pt endorses vomiting all day and states "I'm dehydrated and can't keep anything down" Pt drinking sprite at this time.

## 2017-10-21 LAB — I-STAT CG4 LACTIC ACID, ED: Lactic Acid, Venous: 2.87 mmol/L (ref 0.5–1.9)

## 2017-10-21 MED ORDER — ONDANSETRON HCL 4 MG PO TABS: 4.0000 mg | ORAL_TABLET | Freq: Four times a day (QID) | ORAL | 0 refills | Status: AC

## 2017-10-21 NOTE — Discharge Instructions (Signed)
Make sure to drink plenty of fluids over the next few days, specifically water and Gatorade.  Begin bland foods such as bananas, rice, applesauce, toast.  Avoid fatty, greasy, spicy foods until you are tolerating more bland foods.  Recommend avoiding alcohol for a while.  Please return to the emergency department if you develop any new or worsening symptoms including severe abdominal pain, fever over 100.4, intractable vomiting, or any other concerning symptom.

## 2017-10-21 NOTE — ED Provider Notes (Signed)
Sign out at shift change from Dr. Ranae PalmsYelverton  Briefly, patient is a previously healthy 26-year nausea vomiting after drinking a lot last night.  He has been vomiting all day and states he feels dehydrated.  Patient also smokes marijuana intermittently.  There could be hyperemesis component.  Abdomen is soft and nontender.   2 L of fluids, Zofran, Capzasin cream have been given.  Plan to follow-up symptomatic control and repeat lactic.  Previous provider doubts sepsis as cause of elevated lactic, 4.22.  Most likely dehydration and we will not treat as sepsis.  After 2 L of fluid, patient's lactate has decreased and is trending down to 2.87.  Patient is feeling much better and wanting to drink water.  He has kept fluids down in the ED.  On repeat abdominal exam, abdomen is soft and nontender.  Patient denies any abdominal pain at this time.  Will discharge home with Zofran and oral rehydration.  Return precautions discussed.  Patient understands and agrees with plan.  Patient vitals stable and discharged in satisfactory condition.   Emi HolesLaw, Adonia Porada M, PA-C 10/21/17 0036    Loren RacerYelverton, David, MD 10/25/17 [phone]

## 2017-10-21 NOTE — ED Notes (Signed)
ED Provider at bedside. 

## 2018-09-18 ENCOUNTER — Emergency Department (HOSPITAL_COMMUNITY)
Admission: EM | Admit: 2018-09-18 | Discharge: 2018-09-18 | Disposition: A | Discharge status: Home / Self Care | Payer: Self-pay | Attending: Emergency Medicine | Admitting: Emergency Medicine

## 2018-09-18 ENCOUNTER — Other Ambulatory Visit: Payer: Self-pay

## 2018-09-18 ENCOUNTER — Encounter (HOSPITAL_COMMUNITY): Payer: Self-pay | Admitting: Emergency Medicine

## 2018-09-18 ENCOUNTER — Emergency Department (HOSPITAL_COMMUNITY): Payer: Self-pay

## 2018-09-18 DIAGNOSIS — S62324A Displaced fracture of shaft of fourth metacarpal bone, right hand, initial encounter for closed fracture: Secondary | ICD-10-CM | POA: Insufficient documentation

## 2018-09-18 DIAGNOSIS — S022XXA Fracture of nasal bones, initial encounter for closed fracture: Secondary | ICD-10-CM | POA: Insufficient documentation

## 2018-09-18 DIAGNOSIS — Y9389 Activity, other specified: Secondary | ICD-10-CM | POA: Insufficient documentation

## 2018-09-18 DIAGNOSIS — Y929 Unspecified place or not applicable: Secondary | ICD-10-CM | POA: Insufficient documentation

## 2018-09-18 DIAGNOSIS — Y999 Unspecified external cause status: Secondary | ICD-10-CM | POA: Insufficient documentation

## 2018-09-18 MED ORDER — HYDROCODONE-ACETAMINOPHEN 5-325 MG PO TABS: 1.0000 | ORAL_TABLET | ORAL | 0 refills | Status: DC | PRN

## 2018-09-18 MED ORDER — IBUPROFEN 200 MG PO TABS
600.0000 mg | ORAL_TABLET | Freq: Once | ORAL | Status: AC
  Administered 2018-09-18: 600 mg via ORAL
  Filled 2018-09-18: qty 3

## 2018-09-18 MED ORDER — IBUPROFEN 600 MG PO TABS: 600.0000 mg | ORAL_TABLET | Freq: Four times a day (QID) | ORAL | 0 refills | Status: AC | PRN

## 2018-09-18 MED ORDER — HYDROCODONE-ACETAMINOPHEN 5-325 MG PO TABS
1.0000 | ORAL_TABLET | Freq: Once | ORAL | Status: AC
  Administered 2018-09-18: 1 via ORAL
  Filled 2018-09-18: qty 1
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
## 2018-09-18 NOTE — ED Notes (Signed)
Patient transported to CT 

## 2018-09-18 NOTE — ED Notes (Signed)
Ortho called to apply splint. 

## 2018-09-18 NOTE — ED Notes (Signed)
Patient provided with ice pack

## 2018-09-18 NOTE — ED Notes (Signed)
Bed: WTR6 Expected date:  Expected time:  Means of arrival:  Comments: 
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
## 2018-09-26 ENCOUNTER — Telehealth: Payer: Self-pay

## 2018-09-26 ENCOUNTER — Emergency Department (HOSPITAL_COMMUNITY)
Admission: EM | Admit: 2018-09-26 | Discharge: 2018-09-26 | Disposition: A | Discharge status: Home / Self Care | Payer: Self-pay | Attending: Emergency Medicine | Admitting: Emergency Medicine

## 2018-09-26 ENCOUNTER — Other Ambulatory Visit: Payer: Self-pay

## 2018-09-26 ENCOUNTER — Encounter (HOSPITAL_COMMUNITY): Payer: Self-pay

## 2018-09-26 DIAGNOSIS — X58XXXD Exposure to other specified factors, subsequent encounter: Secondary | ICD-10-CM | POA: Insufficient documentation

## 2018-09-26 DIAGNOSIS — M79641 Pain in right hand: Secondary | ICD-10-CM | POA: Insufficient documentation

## 2018-09-26 DIAGNOSIS — S62324D Displaced fracture of shaft of fourth metacarpal bone, right hand, subsequent encounter for fracture with routine healing: Secondary | ICD-10-CM | POA: Insufficient documentation

## 2018-09-26 MED ORDER — OXYCODONE-ACETAMINOPHEN 5-325 MG PO TABS: 1.0000 | ORAL_TABLET | Freq: Three times a day (TID) | ORAL | 0 refills | Status: AC | PRN

## 2018-09-26 MED ORDER — OXYCODONE-ACETAMINOPHEN 5-325 MG PO TABS
1.0000 | ORAL_TABLET | Freq: Once | ORAL | Status: AC
  Administered 2018-09-26: 1 via ORAL
  Filled 2018-09-26: qty 1

## 2018-09-26 NOTE — Telephone Encounter (Signed)
Message received from Eldridge Abrahams, RN CM requesting a hospital follow up appointment for the patient at [facility] care clinic.  Attempted to contact the patient x2 # [phone] and both times the message stated that the call could not be completed at this time   The contact number for [facility] and [facility] are noted on the ED AVS.  Update provided to A. Kritzer, RN CM
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
## 2018-09-26 NOTE — ED Notes (Signed)
Bed: WTR7 Expected date:  Expected time:  Means of arrival:  Comments: 

## 2018-09-26 NOTE — Care Management Note (Signed)
Case Management Note  CM consulted for no pcp and no ins with need for hand surgery per Dr. Melvyn Novas.  CM spoke with Samson Frederic, PA and advised information for the [facility] clinics with financial counseling and pharmacy info will be placed on AVS.  Message sent to [facility] CM as high priority for establishment of care and financial counseling.  No further CM needs noted at this time.  Cheskel Silverio, Lynnae Sandhoff, RN 09/26/2018, 12:52 PM

## 2018-09-26 NOTE — Discharge Instructions (Signed)
You should call Dr. Glenna Durand office to make an appointment for follow-up in regards to your fracture in your hand today.  Please utilize the services that were placed on your discharge paperwork so that you can ensure proper follow-up in regards to your hand.  Prescription given for percocet. Take medication as directed and do not operate machinery, drive a car, or work while taking this medication as it can make you drowsy.   Please return to the emergency department for any new or worsening symptoms in the meantime.

## 2018-10-08 ENCOUNTER — Other Ambulatory Visit: Payer: Self-pay

## 2018-10-08 ENCOUNTER — Encounter (HOSPITAL_COMMUNITY): Payer: Self-pay | Admitting: Emergency Medicine

## 2018-10-08 ENCOUNTER — Emergency Department (HOSPITAL_COMMUNITY)
Admission: EM | Admit: 2018-10-08 | Discharge: 2018-10-08 | Disposition: A | Discharge status: Home / Self Care | Payer: Self-pay | Attending: Emergency Medicine | Admitting: Emergency Medicine

## 2018-10-08 DIAGNOSIS — S62304P Unspecified fracture of fourth metacarpal bone, right hand, subsequent encounter for fracture with malunion: Secondary | ICD-10-CM | POA: Insufficient documentation

## 2018-10-08 DIAGNOSIS — R600 Localized edema: Secondary | ICD-10-CM | POA: Insufficient documentation

## 2018-10-08 DIAGNOSIS — F17200 Nicotine dependence, unspecified, uncomplicated: Secondary | ICD-10-CM | POA: Insufficient documentation

## 2018-10-08 DIAGNOSIS — Z79899 Other long term (current) drug therapy: Secondary | ICD-10-CM | POA: Insufficient documentation

## 2018-10-08 HISTORY — DX: Unspecified fracture of unspecified wrist and hand, initial encounter for closed fracture: S62.90XA

## 2018-10-08 HISTORY — DX: Unspecified fracture of unspecified hand, initial encounter for closed fracture: S62.90XA

## 2018-10-08 MED ORDER — NAPROXEN 500 MG PO TABS: 500.0000 mg | ORAL_TABLET | Freq: Two times a day (BID) | ORAL | 0 refills | Status: AC
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
## 2018-10-08 NOTE — ED Notes (Signed)
Patient verbalizes understanding of discharge instructions. Opportunity for questioning and answers were provided. Armband removed by staff, pt discharged from ED ambulatory.   
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
## 2018-10-08 NOTE — Progress Notes (Signed)
Orthopedic Tech Progress Note Patient Details:  Francisco Gibson Feb 21, 1991 [id number]  Ortho Devices Type of Ortho Device: Ace wrap, Ulna gutter splint Ortho Device/Splint Interventions: Application   Post Interventions Patient Tolerated: Well Instructions Provided: Care of device   Saul Fordyce 10/08/2018, 6:17 PM

## 2018-10-08 NOTE — Discharge Instructions (Addendum)
Is very important that you follow-up with the clinics given to you in this paperwork. This includes the [place] [facility], [facility], or [place] [facility]. Take naproxen twice a day with meals. Return to the emergency room with any new, worsening or concerning symptoms.

## 2018-10-17 ENCOUNTER — Encounter (HOSPITAL_COMMUNITY): Payer: Self-pay | Admitting: *Deleted

## 2018-10-17 ENCOUNTER — Other Ambulatory Visit: Payer: Self-pay

## 2018-10-17 NOTE — Progress Notes (Signed)
I instructed Mr Francisco Gibson to Stop Advil and Aleve. Mr Francisco Gibson denies surgical history or medical history.
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
## 2018-10-18 ENCOUNTER — Encounter (HOSPITAL_COMMUNITY): Payer: Self-pay | Admitting: *Deleted

## 2018-10-18 ENCOUNTER — Ambulatory Visit (HOSPITAL_COMMUNITY): Payer: Self-pay

## 2018-10-18 ENCOUNTER — Ambulatory Visit (HOSPITAL_COMMUNITY): Payer: Self-pay | Admitting: Anesthesiology

## 2018-10-18 ENCOUNTER — Ambulatory Visit (HOSPITAL_COMMUNITY)
Admission: RE | Admit: 2018-10-18 | Discharge: 2018-10-18 | Disposition: A | Discharge status: Home / Self Care | Payer: Self-pay | Source: Ambulatory Visit | Attending: Orthopedic Surgery | Admitting: Orthopedic Surgery

## 2018-10-18 ENCOUNTER — Inpatient Hospital Stay: Payer: Self-pay

## 2018-10-18 ENCOUNTER — Encounter (HOSPITAL_COMMUNITY)
Admission: RE | Disposition: A | Discharge status: Home / Self Care | Payer: Self-pay | Source: Ambulatory Visit | Attending: Orthopedic Surgery

## 2018-10-18 DIAGNOSIS — Z419 Encounter for procedure for purposes other than remedying health state, unspecified: Secondary | ICD-10-CM

## 2018-10-18 DIAGNOSIS — S62201A Unspecified fracture of first metacarpal bone, right hand, initial encounter for closed fracture: Secondary | ICD-10-CM

## 2018-10-18 DIAGNOSIS — F17210 Nicotine dependence, cigarettes, uncomplicated: Secondary | ICD-10-CM | POA: Insufficient documentation

## 2018-10-18 DIAGNOSIS — S62324A Displaced fracture of shaft of fourth metacarpal bone, right hand, initial encounter for closed fracture: Secondary | ICD-10-CM | POA: Insufficient documentation

## 2018-10-18 HISTORY — PX: OPEN REDUCTION INTERNAL FIXATION (ORIF) METACARPAL: SHX6234

## 2018-10-18 HISTORY — DX: Other specified health status: Z78.9

## 2018-10-18 LAB — COMPREHENSIVE METABOLIC PANEL
ALBUMIN: 4.2 g/dL (ref 3.5–5.0)
ALT: 73 U/L — AB (ref 0–44)
AST: 43 U/L — AB (ref 15–41)
Alkaline Phosphatase: 97 U/L (ref 38–126)
Anion gap: 12 (ref 5–15)
BUN: 7 mg/dL (ref 6–20)
CHLORIDE: 102 mmol/L (ref 98–111)
CO2: 23 mmol/L (ref 22–32)
CREATININE: 0.7 mg/dL (ref 0.61–1.24)
Calcium: 9.5 mg/dL (ref 8.9–10.3)
GFR calc Af Amer: >60 mL/min (ref >60)
GLUCOSE: 92 mg/dL (ref 70–99)
POTASSIUM: 3.9 mmol/L (ref 3.5–5.1)
Sodium: 137 mmol/L (ref 135–145)
TOTAL PROTEIN: 7.7 g/dL (ref 6.5–8.1)
Total Bilirubin: 0.8 mg/dL (ref 0.3–1.2)

## 2018-10-18 LAB — CBC
HEMATOCRIT: 48.6 % (ref 39.0–52.0)
Hemoglobin: 15.1 g/dL (ref 13.0–17.0)
MCH: 27.3 pg (ref 26.0–34.0)
MCHC: 31.1 g/dL (ref 30.0–36.0)
MCV: 87.7 fL (ref 80.0–100.0)
Platelets: 344 10*3/uL (ref 150–400)
RBC: 5.54 MIL/uL (ref 4.22–5.81)
RDW: 12.9 % (ref 11.5–15.5)
WBC: 8.1 10*3/uL (ref 4.0–10.5)
nRBC: 0 % (ref 0.0–0.2)

## 2018-10-18 SURGERY — OPEN REDUCTION INTERNAL FIXATION (ORIF) METACARPAL
Anesthesia: General | Laterality: Right

## 2018-10-18 MED ORDER — SUCCINYLCHOLINE 20MG/ML (10ML) SYRINGE FOR MEDFUSION PUMP - OPTIME
INTRAMUSCULAR | Status: DC | PRN
  Administered 2018-10-18: 60 mg via INTRAVENOUS

## 2018-10-18 MED ORDER — CEFAZOLIN SODIUM-DEXTROSE 2-4 GM/100ML-% IV SOLN
INTRAVENOUS | Status: AC
  Filled 2018-10-18: qty 100

## 2018-10-18 MED ORDER — LIDOCAINE 2% (20 MG/ML) 5 ML SYRINGE
INTRAMUSCULAR | Status: DC | PRN
  Administered 2018-10-18 (×2): 60 mg via INTRAVENOUS

## 2018-10-18 MED ORDER — PROPOFOL 500 MG/50ML IV EMUL
INTRAVENOUS | Status: DC | PRN
  Administered 2018-10-18: 125 ug/kg/min via INTRAVENOUS

## 2018-10-18 MED ORDER — FENTANYL CITRATE (PF) 250 MCG/5ML IJ SOLN
INTRAMUSCULAR | Status: AC
  Filled 2018-10-18: qty 5

## 2018-10-18 MED ORDER — FENTANYL CITRATE (PF) 100 MCG/2ML IJ SOLN
INTRAMUSCULAR | Status: DC | PRN
  Administered 2018-10-18 (×2): 50 ug via INTRAVENOUS

## 2018-10-18 MED ORDER — FENTANYL CITRATE (PF) 100 MCG/2ML IJ SOLN
INTRAMUSCULAR | Status: AC
  Administered 2018-10-18: 100 ug
  Filled 2018-10-18: qty 2

## 2018-10-18 MED ORDER — LACTATED RINGERS IV SOLN
INTRAVENOUS | Status: DC
  Administered 2018-10-18 (×2): via INTRAVENOUS

## 2018-10-18 MED ORDER — MIDAZOLAM HCL 2 MG/2ML IJ SOLN
INTRAMUSCULAR | Status: AC
  Administered 2018-10-18: 2 mg
  Filled 2018-10-18: qty 2

## 2018-10-18 MED ORDER — CEFAZOLIN SODIUM-DEXTROSE 2-4 GM/100ML-% IV SOLN
2.0000 g | INTRAVENOUS | Status: AC
  Administered 2018-10-18: 2 g via INTRAVENOUS

## 2018-10-18 MED ORDER — MENTHOL 3 MG MT LOZG
LOZENGE | OROMUCOSAL | Status: AC
  Administered 2018-10-18: 19:00:00
  Filled 2018-10-18: qty 9

## 2018-10-18 MED ORDER — PROPOFOL 10 MG/ML IV BOLUS
INTRAVENOUS | Status: DC | PRN
  Administered 2018-10-18: 20 mg via INTRAVENOUS
  Administered 2018-10-18: 150 mg via INTRAVENOUS
  Administered 2018-10-18 (×2): 30 mg via INTRAVENOUS

## 2018-10-18 MED ORDER — MEPERIDINE HCL 50 MG/ML IJ SOLN: 6.2500 mg | INTRAMUSCULAR | Status: DC | PRN

## 2018-10-18 MED ORDER — MIDAZOLAM HCL 2 MG/2ML IJ SOLN
INTRAMUSCULAR | Status: AC
  Filled 2018-10-18: qty 2

## 2018-10-18 MED ORDER — PROPOFOL 10 MG/ML IV BOLUS
INTRAVENOUS | Status: AC
  Filled 2018-10-18: qty 20

## 2018-10-18 MED ORDER — CHLORHEXIDINE GLUCONATE 4 % EX LIQD: 60.0000 mL | Freq: Once | CUTANEOUS | Status: DC

## 2018-10-18 MED ORDER — ONDANSETRON HCL 4 MG/2ML IJ SOLN: 4.0000 mg | Freq: Once | INTRAMUSCULAR | Status: DC | PRN

## 2018-10-18 MED ORDER — BUPIVACAINE-EPINEPHRINE (PF) 0.5% -1:200000 IJ SOLN
INTRAMUSCULAR | Status: DC | PRN
  Administered 2018-10-18: 30 mL via PERINEURAL

## 2018-10-18 MED ORDER — ONDANSETRON HCL 4 MG/2ML IJ SOLN
INTRAMUSCULAR | Status: DC | PRN
  Administered 2018-10-18: 4 mg via INTRAVENOUS

## 2018-10-18 MED ORDER — 0.9 % SODIUM CHLORIDE (POUR BTL) OPTIME
TOPICAL | Status: DC | PRN
  Administered 2018-10-18: 1000 mL

## 2018-10-18 MED ORDER — HYDROMORPHONE HCL 1 MG/ML IJ SOLN: 0.2500 mg | INTRAMUSCULAR | Status: DC | PRN

## 2018-10-18 MED ORDER — MIDAZOLAM HCL 2 MG/2ML IJ SOLN
INTRAMUSCULAR | Status: DC | PRN
  Administered 2018-10-18: 2 mg via INTRAVENOUS

## 2018-10-18 SURGICAL SUPPLY — 58 items
BANDAGE ACE 3X5.8 VEL STRL LF (GAUZE/BANDAGES/DRESSINGS) ×3 IMPLANT
BANDAGE ACE 4X5 VEL STRL LF (GAUZE/BANDAGES/DRESSINGS) ×3 IMPLANT
BIT DRILL 2 FAST STEP (BIT) ×3 IMPLANT
BLADE CLIPPER SURG (BLADE) IMPLANT
BNDG ESMARK 4X9 LF (GAUZE/BANDAGES/DRESSINGS) ×3 IMPLANT
BNDG GAUZE ELAST 4 BULKY (GAUZE/BANDAGES/DRESSINGS) ×3 IMPLANT
CLOSURE WOUND 1/2 X4 (GAUZE/BANDAGES/DRESSINGS)
CORDS BIPOLAR (ELECTRODE) ×3 IMPLANT
COVER SURGICAL LIGHT HANDLE (MISCELLANEOUS) ×3 IMPLANT
COVER WAND RF STERILE (DRAPES) IMPLANT
CUFF TOURNIQUET SINGLE 18IN (TOURNIQUET CUFF) ×3 IMPLANT
CUFF TOURNIQUET SINGLE 24IN (TOURNIQUET CUFF) IMPLANT
DRAIN TLS ROUND 10FR (DRAIN) IMPLANT
DRAPE OEC MINIVIEW 54X84 (DRAPES) ×3 IMPLANT
DRAPE SURG 17X11 SM STRL (DRAPES) ×3 IMPLANT
DRSG ADAPTIC 3X8 NADH LF (GAUZE/BANDAGES/DRESSINGS) ×3 IMPLANT
GAUZE 4X4 16PLY RFD (DISPOSABLE) ×3 IMPLANT
GAUZE SPONGE 4X4 12PLY STRL (GAUZE/BANDAGES/DRESSINGS) IMPLANT
GLOVE BIOGEL PI IND STRL 8.5 (GLOVE) ×1 IMPLANT
GLOVE BIOGEL PI INDICATOR 8.5 (GLOVE) ×2
GLOVE SURG ORTHO 8.0 STRL STRW (GLOVE) ×3 IMPLANT
GOWN STRL REUS W/ TWL LRG LVL3 (GOWN DISPOSABLE) ×3 IMPLANT
GOWN STRL REUS W/ TWL XL LVL3 (GOWN DISPOSABLE) ×1 IMPLANT
GOWN STRL REUS W/TWL LRG LVL3 (GOWN DISPOSABLE) ×6
GOWN STRL REUS W/TWL XL LVL3 (GOWN DISPOSABLE) ×2
K-WIRE .062 (WIRE) ×2
K-WIRE FX6X.062X2 END TROC (WIRE) ×1
KIT BASIN OR (CUSTOM PROCEDURE TRAY) ×3 IMPLANT
KIT TURNOVER KIT B (KITS) ×3 IMPLANT
KWIRE FX6X.062X2 END TROC (WIRE) ×1 IMPLANT
MANIFOLD NEPTUNE II (INSTRUMENTS) IMPLANT
NEEDLE HYPO 25X1 1.5 SAFETY (NEEDLE) ×3 IMPLANT
NS IRRIG 1000ML POUR BTL (IV SOLUTION) ×3 IMPLANT
PACK ORTHO EXTREMITY (CUSTOM PROCEDURE TRAY) ×3 IMPLANT
PAD ARMBOARD 7.5X6 YLW CONV (MISCELLANEOUS) IMPLANT
PAD CAST 4YDX4 CTTN HI CHSV (CAST SUPPLIES) IMPLANT
PADDING CAST COTTON 4X4 STRL (CAST SUPPLIES)
PLATE LOCKING 2.5 STRAIGHT (Plate) ×3 IMPLANT
SCREW PEG 2.5X13 NONLOCK (Screw) ×3 IMPLANT
SCREW PEG LOCK 2.5X12 (Screw) ×3 IMPLANT
SCREW PEG LOCK 2.5X13 (Screw) ×3 IMPLANT
SCREW PEG LOCK 2.5X14 (Peg) ×3 IMPLANT
SCREW PEG LOCK 2.5X16 (Peg) ×6 IMPLANT
SOAP 2 % CHG 4 OZ (WOUND CARE) ×3 IMPLANT
SPLINT FIBERGLASS 3X35 (CAST SUPPLIES) ×3 IMPLANT
STRIP CLOSURE SKIN 1/2X4 (GAUZE/BANDAGES/DRESSINGS) IMPLANT
SUT ETHILON 4 0 PS 2 18 (SUTURE) IMPLANT
SUT MNCRL AB 4-0 PS2 18 (SUTURE) IMPLANT
SUT VIC AB 2-0 FS1 27 (SUTURE) IMPLANT
SUT VICRYL 4-0 PS2 18IN ABS (SUTURE) ×3 IMPLANT
SYR CONTROL 10ML LL (SYRINGE) IMPLANT
SYSTEM CHEST DRAIN TLS 7FR (DRAIN) IMPLANT
TOWEL OR 17X24 6PK STRL BLUE (TOWEL DISPOSABLE) ×3 IMPLANT
TOWEL OR 17X26 10 PK STRL BLUE (TOWEL DISPOSABLE) ×3 IMPLANT
TUBE CONNECTING 12'X1/4 (SUCTIONS) ×1
TUBE CONNECTING 12X1/4 (SUCTIONS) ×2 IMPLANT
WATER STERILE IRR 1000ML POUR (IV SOLUTION) ×3 IMPLANT
YANKAUER SUCT BULB TIP NO VENT (SUCTIONS) IMPLANT
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
## 2018-10-18 NOTE — Progress Notes (Signed)
Called to OR 4 with Pt having had suspected laryngospasm and desaturation on emergence. CRNA administered Succinylcholine. We were able to ventilate Pt after placement of an oral airway. Lowest Saturation I saw was 65%. Lungs sounds revealed crackles throughout both lung fields. Initial diagnosis was negative pressure pulmonary edema. CXR at this point in the OR showed mild interstitial edema. On transfer to PACU pt became more lucid. O2 Sat quickly rose to 100% on O2. And to 95-96% on room air. Breath sounds also cleared on exam.  Good Supraclavicular block. Pt has no hand pain.  Plan: Will discharge home if continues to improve. Dr Hart RochesterHollis aware of pts condition and will follow. Arta BruceKevin Gwenlyn Hottinger MD

## 2018-10-18 NOTE — Discharge Instructions (Signed)
KEEP BANDAGE CLEAN AND DRY CALL OFFICE FOR F/U APPT [phone] in 15 days KEEP HAND ELEVATED ABOVE HEART OK TO APPLY ICE TO OPERATIVE AREA CONTACT OFFICE IF ANY WORSENING PAIN OR CONCERNS.  

## 2018-10-18 NOTE — Progress Notes (Signed)
Orthopedic Tech Progress Note Patient Details:  Francisco Gibson July 16, 1991 [id number]  Ortho Devices Type of Ortho Device: Arm sling Ortho Device/Splint Interventions: Rich BraveOrdered       [name] J 10/18/2018, 6:24 PM

## 2018-10-18 NOTE — Transfer of Care (Addendum)
Immediate Anesthesia Transfer of Care Note  Patient: Francisco Gibson  Procedure(s) Performed: OPEN REDUCTION INTERNAL FIXATION (ORIF) METACARPAL (Right )  Patient Location: PACU  Anesthesia Type:General  Level of Consciousness: Awake Alert Oriented x3  Airway & Oxygen Therapy: Patient Spontanous Breathing FM 02 Sat 1000%  Post-op Assessment: Report given to RN and Post -op Vital signs reviewed and stable  Post vital signs: Reviewed and stable  Last Vitals:  Vitals Value Taken Time  BP    Temp    Pulse    Resp    SpO2      Last Pain:  Vitals:   10/18/18 1348  TempSrc:   PainSc: 9          Complications: laryngospasm on emergence treated with succinylcholine.  Lungs auscultated and clear in PACU per Dr. Michelle Piperssey.  RR even and unlabored.

## 2018-10-18 NOTE — Progress Notes (Signed)
During pt stay in PACU, pt O2sats remained above 94%. After pt woke up more his O2sats remained above 97%ra Pt denied any shortness of breath or CP. Pt was informed along with other discharge instructions to come to the emergency room if he were to have any shortness of breath.

## 2018-10-18 NOTE — Anesthesia Preprocedure Evaluation (Signed)
Anesthesia Evaluation  Patient identified by MRN, date of birth, ID band Patient awake    Reviewed: Allergy & Precautions, NPO status   Airway Mallampati: I  TM Distance: >3 FB Neck ROM: Full    Dental   Pulmonary former smoker,    Pulmonary exam normal        Cardiovascular Normal cardiovascular exam     Neuro/Psych    GI/Hepatic   Endo/Other    Renal/GU      Musculoskeletal   Abdominal   Peds  Hematology   Anesthesia Other Findings   Reproductive/Obstetrics                             Anesthesia Physical Anesthesia Plan  ASA: II  Anesthesia Plan: General   Post-op Pain Management:  Regional for Post-op pain   Induction:   PONV Risk Score and Plan: 2 and Ondansetron, Treatment may vary due to age or medical condition and Midazolam  Airway Management Planned: LMA  Additional Equipment:   Intra-op Plan:   Post-operative Plan: Extubation in OR  Informed Consent: I have reviewed the patients History and Physical, chart, labs and discussed the procedure including the risks, benefits and alternatives for the proposed anesthesia with the patient or authorized representative who has indicated his/her understanding and acceptance.     Plan Discussed with: CRNA and Surgeon  Anesthesia Plan Comments:         Anesthesia Quick Evaluation

## 2018-10-18 NOTE — Anesthesia Procedure Notes (Signed)
Anesthesia Regional Block: Supraclavicular block   Pre-Anesthetic Checklist: ,, timeout performed, Correct Patient, Correct Site, Correct Laterality, Correct Procedure, Correct Position, site marked, Risks and benefits discussed,  Surgical consent,  Pre-op evaluation,  At surgeon's request and post-op pain management  Laterality: Right  Prep: chloraprep       Needles:   Needle Type: Echogenic Stimulator Needle     Needle Length: 9cm  Needle Gauge: 21     Additional Needles:   Procedures:, nerve stimulator,,,,,,,   Nerve Stimulator or Paresthesia:  Response: 0.4 mA,   Additional Responses:   Narrative:  Start time: 10/18/2018 3:40 PM End time: 10/18/2018 3:55 PM Injection made incrementally with aspirations every 5 mL.  Performed by: Personally  Anesthesiologist: Arta Brucessey, Shaterrica Territo, MD  Additional Notes: Monitors applied. Patient sedated. Sterile prep and drape,hand hygiene and sterile gloves were used. Relevant anatomy identified.Needle position confirmed.Local anesthetic injected incrementally after negative aspiration. Local anesthetic spread visualized around nerve(s). Vascular puncture avoided. No complications. Image printed for medical record.The patient tolerated the procedure well.

## 2018-10-19 ENCOUNTER — Encounter (HOSPITAL_COMMUNITY): Payer: Self-pay | Admitting: Orthopedic Surgery

## 2018-10-19 NOTE — Anesthesia Postprocedure Evaluation (Signed)
Anesthesia Post Note  Patient: Rosealee Albeeyshawn Vazguez  Procedure(s) Performed: OPEN REDUCTION INTERNAL FIXATION (ORIF) METACARPAL (Right )     Patient location during evaluation: PACU Anesthesia Type: General Level of consciousness: awake and alert Pain management: pain level controlled Vital Signs Assessment: post-procedure vital signs reviewed and stable Respiratory status: spontaneous breathing, nonlabored ventilation, respiratory function stable and patient connected to nasal cannula oxygen Cardiovascular status: blood pressure returned to baseline and stable Postop Assessment: no apparent nausea or vomiting Anesthetic complications: yes Anesthetic complication details: LaryngospasmComments: Possible mild negative pressure pulmonary edema. Cleared spontaneously in PACU. No lasix needed Pt ->home    Last Vitals:  Vitals:   10/18/18 1830 10/18/18 1845  BP: (!) 137/98 131/81  Pulse: 93 88  Resp: 20 20  Temp:  36.5 C  SpO2: 96% 96%    Last Pain:  Vitals:   10/18/18 1845  TempSrc:   PainSc: 0-No pain                 Aashvi Rezabek DAVID

## 2018-11-15 ENCOUNTER — Encounter: Payer: Self-pay | Admitting: *Deleted

## 2018-11-15 ENCOUNTER — Other Ambulatory Visit: Payer: Self-pay

## 2018-11-15 ENCOUNTER — Ambulatory Visit: Payer: Self-pay | Attending: Physician Assistant | Admitting: *Deleted

## 2018-11-15 DIAGNOSIS — R278 Other lack of coordination: Secondary | ICD-10-CM | POA: Insufficient documentation

## 2018-11-15 DIAGNOSIS — M6281 Muscle weakness (generalized): Secondary | ICD-10-CM | POA: Insufficient documentation

## 2018-11-15 DIAGNOSIS — R600 Localized edema: Secondary | ICD-10-CM | POA: Insufficient documentation

## 2018-11-15 DIAGNOSIS — M79641 Pain in right hand: Secondary | ICD-10-CM | POA: Insufficient documentation
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
## 2018-11-15 NOTE — Patient Instructions (Signed)
WEARING SCHEDULE:  Wear splint at ALL times except for hygiene care (May remove splint for exercises and then immediately place back on ONLY if directed by the therapist)  PURPOSE:  To prevent movement and for protection until injury can heal  CARE OF SPLINT:  Keep splint away from heat sources including: stove, radiator or furnace, or a car in sunlight. The splint can melt and will no longer fit you properly  Keep away from pets and children  Clean the splint with rubbing alcohol as needed.  * During this time, make sure you also clean your hand/arm as instructed by your therapist and/or perform dressing changes as needed. Then dry hand/arm completely before replacing splint. (When cleaning hand/arm, keep it immobilized in same position until splint is replaced)  PRECAUTIONS/POTENTIAL PROBLEMS: *If you notice or experience increased pain, swelling, numbness, or a lingering reddened area from the splint: Contact your therapist immediately by calling [phone]. You must wear the splint for protection, but we will get you scheduled for adjustments as quickly as possible.  (If only straps or hooks need to be replaced and NO adjustments to the splint need to be made, just call the office ahead and let them know you are coming in)  If you have any medical concerns or signs of infection, please call your doctor immediately    *Remove the splint 4-6 times per day and do the following exercises with your right hand. Do 5-10 reps each and then place the splint back on for protection purposes.   MP Extension (Active)    With palm on table, straighten fingers completely at large knuckles, and lift fingers off table. Hold ____ seconds. Repeat ____ times. Do ____ sessions per day. Activity: Tap fingers one at a time on table.*  MP Flexion (Active)    With back of hand on table, bend large knuckles as far as they will go, keeping small joints straight. Repeat ____ times. Do ____ sessions  per day. Activity: Reach into a narrow container.*  Flexor Tendon Gliding (Active Full Fist)    Straighten all fingers, then make a fist, bending all joints. Repeat ____ times. Do ____ sessions per day.  Flexor Tendon Gliding (Active Straight Fist)    Start with fingers straight. Bend knuckles and middle joints. Keep fingertip joints straight to touch base of palm. Repeat ____ times. Do ____ sessions per day.  Supination (Active)    With elbow held at right angle and kept at side, turn palm upward as far as possible. Hold ____ seconds. Repeat ____ times. Do ____ sessions per day. Activity: Use this motion when turning cards over.*  Copyright  VHI. All rights reserved.  Pronation (Active)    With elbow held at side, wrist straight and palm facing up, turn until palm faces down completely. Hold ____ seconds. Repeat ____ times. Do ____ sessions per day. Activity: Use this motion to turn pages of a book.*    Elevate your hand above your heart. Stretch your shoulder and elbow from time to time.

## 2018-11-22 ENCOUNTER — Ambulatory Visit: Payer: Self-pay | Admitting: Occupational Therapy

## 2018-11-24 ENCOUNTER — Ambulatory Visit: Payer: Self-pay | Admitting: Occupational Therapy
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
## 2019-06-30 IMAGING — CR DG CHEST 1V PORT
1 series · 1 of 1 positions shown · non-contrast
Comparison: None.

CLINICAL DATA: 27-year-old male with suspected negative pressure
pulmonary edema

EXAM:
PORTABLE CHEST 1 VIEW

[AP]
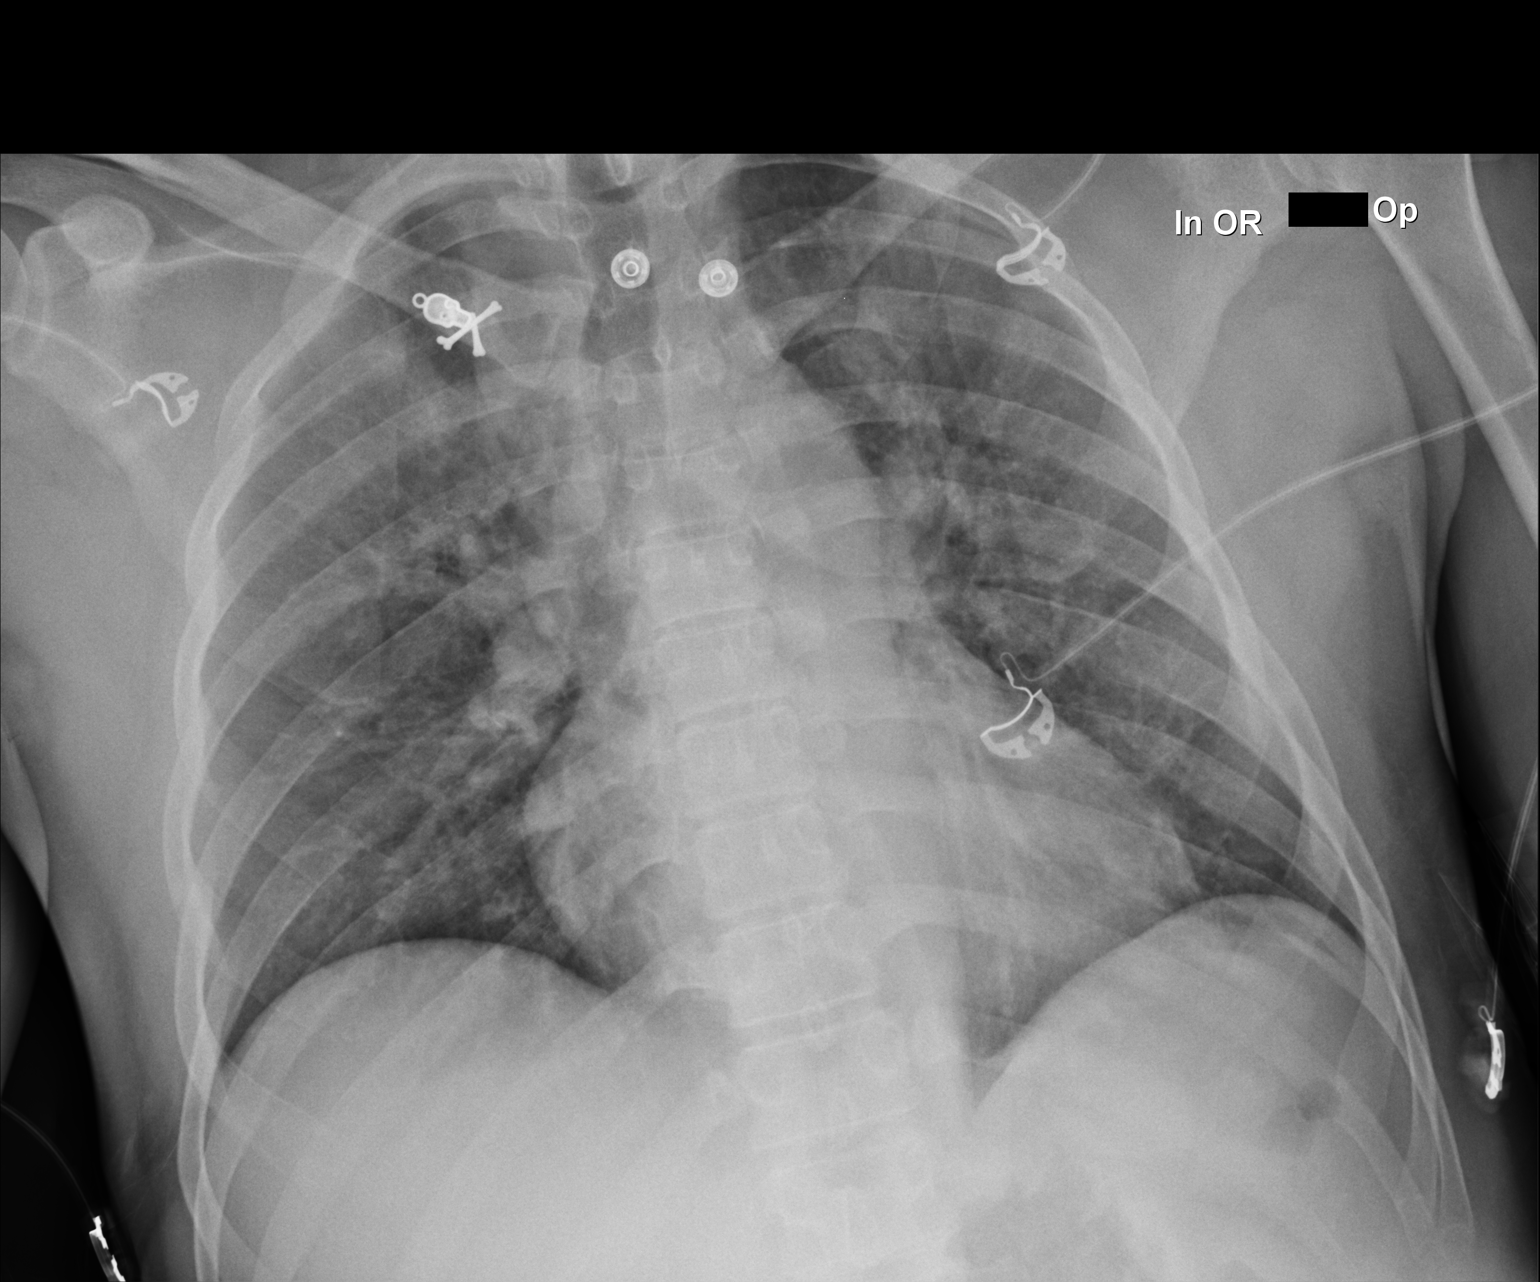

[1 of 1 positions shown; findings below may reference images not displayed]

FINDINGS: Normal cardiac and mediastinal contours. Inspiratory volumes are
low. There does appear to be pulmonary vascular congestion with mild
perihilar interstitial airspace opacities concerning for
noncardiogenic edema. No pleural effusion or pneumothorax. No acute
osseous abnormality.
IMPRESSION: Low lung volumes with evidence of pulmonary vascular congestion
bordering on mild interstitial edema. This is concerning for
noncardiogenic pulmonary edema, and specifically for the suspected
entity of negative pressure pulmonary edema in the postoperative
setting.

## 2020-03-04 ENCOUNTER — Encounter (HOSPITAL_COMMUNITY): Payer: Self-pay

## 2020-03-04 ENCOUNTER — Emergency Department (HOSPITAL_COMMUNITY)
Admission: EM | Admit: 2020-03-04 | Discharge: 2020-03-04 | Disposition: A | Discharge status: Home / Self Care | Payer: Medicaid Other | Attending: Emergency Medicine | Admitting: Emergency Medicine

## 2020-03-04 ENCOUNTER — Other Ambulatory Visit: Payer: Self-pay

## 2020-03-04 DIAGNOSIS — Z87891 Personal history of nicotine dependence: Secondary | ICD-10-CM | POA: Insufficient documentation

## 2020-03-04 DIAGNOSIS — J020 Streptococcal pharyngitis: Secondary | ICD-10-CM | POA: Insufficient documentation

## 2020-03-04 LAB — GROUP A STREP BY PCR: Group A Strep by PCR: DETECTED — AB

## 2020-03-04 MED ORDER — PENICILLIN G BENZATHINE 1200000 UNIT/2ML IM SUSP
1.2000 10*6.[IU] | Freq: Once | INTRAMUSCULAR | Status: AC
  Administered 2020-03-04: 1.2 10*6.[IU] via INTRAMUSCULAR
  Filled 2020-03-04: qty 2

## 2020-03-04 MED ORDER — DEXAMETHASONE 4 MG PO TABS
10.0000 mg | ORAL_TABLET | Freq: Once | ORAL | Status: AC
  Administered 2020-03-04: 10 mg via ORAL
  Filled 2020-03-04: qty 2

## 2020-03-04 NOTE — Discharge Instructions (Signed)
You are positive for strep throat again today.  You have been treated with antibiotics and steroids.  These are one-time treatments.  I have attached number for ENT for you to discuss if tonsil removal may be an option for you.  Continue Tylenol Motrin for pain as well.

## 2020-03-04 NOTE — ED Triage Notes (Signed)
Patient c/o sore throat x 2 days. 

## 2020-03-04 NOTE — ED Provider Notes (Signed)
[facility] DEPT Provider Note   CSN: [id number] Arrival date & time: 03/04/20  8127     History Chief Complaint  Patient presents with  . Sore Throat    Francisco Gibson is a 29 y.o. male.  The history is provided by the patient.  Sore Throat This is a new problem. The current episode started yesterday. The problem occurs constantly. The problem has not changed since onset.Pertinent negatives include no chest pain, no abdominal pain, no headaches and no shortness of breath. Nothing aggravates the symptoms. Nothing relieves the symptoms. He has tried nothing for the symptoms. The treatment provided no relief.       Past Medical History:  Diagnosis Date  . Hand fracture   . Medical history non-contributory   . Otalgia of both ears     There are no problems to display for this patient.   Past Surgical History:  Procedure Laterality Date  . FRACTURE SURGERY    . NO PAST SURGERIES    . OPEN REDUCTION INTERNAL FIXATION (ORIF) METACARPAL Right 10/18/2018   Procedure: OPEN REDUCTION INTERNAL FIXATION (ORIF) METACARPAL;  Surgeon: Iran Planas, MD;  Location: [facility];  Service: Orthopedics;  Laterality: Right;       Family History  Family history unknown: Yes    Social History   Tobacco Use  . Smoking status: Former Smoker    Packs/day: 0.50    Years: 2.00    Pack years: 1.00    Quit date: 09/2018    Years since quitting: 1.4  . Smokeless tobacco: Never Used  Substance Use Topics  . Alcohol use: Yes    Alcohol/week: 2.0 standard drinks    Types: 2 Cans of beer per week  . 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 Authorizing Provider  ibuprofen (ADVIL) 400 MG tablet Take 400 mg by mouth daily as needed for headache or moderate pain.   Yes [provider]  erythromycin [facility]) ophthalmic ointment Place  1 application into the left eye 4 (four) times daily. FOUR TIMES PER DAY for FIVE DAYS Patient not taking: Reported on 10/20/2017 10/03/17   Shary Decamp, PA-C  ibuprofen (ADVIL,MOTRIN) 600 MG tablet Take 1 tablet (600 mg total) by mouth every 6 (six) hours as needed. Patient not taking: Reported on 11/15/2018 09/18/18   Jacqlyn Larsen, PA-C  loperamide (IMODIUM) 2 MG capsule Take 1 capsule (2 mg total) by mouth 4 (four) times daily as needed for diarrhea or loose stools. Patient not taking: Reported on 10/20/2017 07/30/17   Ward, Delice Bison, DO  naproxen (NAPROSYN) 500 MG tablet Take 1 tablet (500 mg total) by mouth 2 (two) times daily with a meal. Patient not taking: Reported on 11/15/2018 10/08/18   Caccavale, Sophia, PA-C  ondansetron (ZOFRAN) 4 MG tablet Take 1 tablet (4 mg total) every 6 (six) hours by mouth. Patient not taking: Reported on 11/15/2018 10/21/17   Frederica Kuster, PA-C  oxyCODONE-acetaminophen (PERCOCET/ROXICET) 5-325 MG tablet Take 1 tablet by mouth every 8 (eight) hours as needed for severe pain. Patient not taking: Reported on 03/04/2020 09/26/18   Couture, Cortni S, PA-C  prednisoLONE acetate (PRED FORTE) 1 % ophthalmic suspension Place 2 drops into the left eye 4 (four) times daily. Patient not taking: Reported on 07/30/2017 08/15/14   Serita Grit, MD  promethazine (PHENERGAN) 25 MG  tablet Take 1 tablet (25 mg total) by mouth every 6 (six) hours as needed for nausea or vomiting. Patient not taking: Reported on 10/20/2017 07/30/17   Ward, Layla Maw, DO    Allergies    Patient has no known allergies.  Review of Systems   Review of Systems  Constitutional: Negative for fever.  HENT: Positive for sore throat. Negative for congestion, dental problem, drooling, ear pain, facial swelling, nosebleeds, postnasal drip, rhinorrhea, sinus pressure, sinus pain, trouble swallowing and voice change.   Respiratory: Negative for shortness of breath.   Cardiovascular: Negative for chest  pain.  Gastrointestinal: Negative for abdominal pain.  Neurological: Negative for headaches.    Physical Exam Updated Vital Signs BP (!) 140/92 (BP Location: Left Arm)   Pulse 90   Temp 98 F (36.7 C)   Resp 16   Ht 6' (1.829 m)   Wt 77.1 kg   SpO2 98%   BMI 23.06 kg/m   Physical Exam Constitutional:      General: He is not in acute distress.    Appearance: He is not ill-appearing.  HENT:     Head: Normocephalic and atraumatic.     Right Ear: Tympanic membrane normal.     Left Ear: Tympanic membrane normal.     Nose: No congestion.     Mouth/Throat:     Mouth: Mucous membranes are moist.     Pharynx: Posterior oropharyngeal erythema present. No pharyngeal swelling, oropharyngeal exudate or uvula swelling.     Tonsils: No tonsillar exudate or tonsillar abscesses. 1+ on the right. 1+ on the left.  Eyes:     Conjunctiva/sclera: Conjunctivae normal.     Pupils: Pupils are equal, round, and reactive to light.  Musculoskeletal:     Cervical back: Normal range of motion and neck supple.  Lymphadenopathy:     Cervical: No cervical adenopathy.  Neurological:     Mental Status: He is alert.     ED Results / Procedures / Treatments   Labs (all labs ordered are listed, but only abnormal results are displayed) Labs Reviewed  GROUP A STREP BY PCR - Abnormal; Notable for the following components:      Result Value   Group A Strep by PCR DETECTED (*)    All other components within normal limits    EKG None  Radiology No results found.  Procedures Procedures (including critical care time)  Medications Ordered in ED Medications  dexamethasone (DECADRON) tablet 10 mg (has no administration in time range)  penicillin g benzathine (BICILLIN LA) 1200000 UNIT/2ML injection 1.2 Million Units (has no administration in time range)    ED Course  I have reviewed the triage vital signs and the nursing notes.  Pertinent labs & imaging results that were available during my  care of the patient were reviewed by me and considered in my medical decision making (see chart for details).    MDM Rules/Calculators/A&P                      Francisco Gibson is a 29 year old male who presents to the ED with sore throat.  Patient has redness to his tonsils but no obvious exudates or lymphadenopathy.  No abscess.  No muffled voice.  No concern for peritonsillar or retropharyngeal abscess.  Strep test was positive.  Patient treated with penicillin and Decadron.  Given referral for ENT as patient states that he has multiple episodes in a year.  Discharged in good condition.  Understands  return precautions.  This chart was dictated using voice recognition software.  Despite best efforts to proofread,  errors can occur which can change the documentation meaning.   Final Clinical Impression(s) / ED Diagnoses Final diagnoses:  Strep pharyngitis    Rx / DC Orders ED Discharge Orders    None       Virgina Norfolk, DO 03/04/20 1018
